# Patient Record
Sex: Male | Born: 1955 | Race: White | Hispanic: No | Marital: Single | Smoking: Former smoker
Health system: Southern US, Community
[De-identification: ages and names within clinical notes are randomized; demographics above are authoritative.]

## PROBLEM LIST (undated history)

## (undated) DIAGNOSIS — T7840XA Allergy, unspecified, initial encounter: Secondary | ICD-10-CM

## (undated) DIAGNOSIS — N200 Calculus of kidney: Secondary | ICD-10-CM

## (undated) DIAGNOSIS — Z860100 Personal history of colon polyps, unspecified: Secondary | ICD-10-CM

## (undated) DIAGNOSIS — C801 Malignant (primary) neoplasm, unspecified: Secondary | ICD-10-CM

## (undated) HISTORY — DX: Personal history of colon polyps, unspecified: Z86.0100

## (undated) HISTORY — DX: Calculus of kidney: N20.0

## (undated) HISTORY — DX: Malignant (primary) neoplasm, unspecified: C80.1

## (undated) HISTORY — DX: Personal history of colonic polyps: Z86.010

## (undated) HISTORY — DX: Allergy, unspecified, initial encounter: T78.40XA

---

## 2012-06-24 LAB — HM COLONOSCOPY

## 2013-05-09 LAB — HM COLONOSCOPY

## 2014-06-24 LAB — HM COLONOSCOPY

## 2017-06-01 ENCOUNTER — Ambulatory Visit: Payer: Self-pay | Admitting: Internal Medicine

## 2017-06-22 ENCOUNTER — Encounter: Payer: Self-pay | Admitting: Internal Medicine

## 2017-06-22 ENCOUNTER — Ambulatory Visit: Payer: BC Managed Care – PPO | Admitting: Internal Medicine

## 2017-06-22 VITALS — BP 100/72 | HR 57 | Temp 97.5°F | Resp 15 | Ht 74.25 in | Wt 177.8 lb

## 2017-06-22 DIAGNOSIS — M778 Other enthesopathies, not elsewhere classified: Secondary | ICD-10-CM

## 2017-06-22 DIAGNOSIS — E785 Hyperlipidemia, unspecified: Secondary | ICD-10-CM | POA: Diagnosis not present

## 2017-06-22 DIAGNOSIS — R5383 Other fatigue: Secondary | ICD-10-CM | POA: Diagnosis not present

## 2017-06-22 DIAGNOSIS — Z13220 Encounter for screening for lipoid disorders: Secondary | ICD-10-CM | POA: Diagnosis not present

## 2017-06-22 DIAGNOSIS — L659 Nonscarring hair loss, unspecified: Secondary | ICD-10-CM | POA: Diagnosis not present

## 2017-06-22 DIAGNOSIS — Z0000 Encounter for general adult medical examination without abnormal findings: Secondary | ICD-10-CM | POA: Diagnosis not present

## 2017-06-22 DIAGNOSIS — Z87891 Personal history of nicotine dependence: Secondary | ICD-10-CM | POA: Diagnosis not present

## 2017-06-22 DIAGNOSIS — Z125 Encounter for screening for malignant neoplasm of prostate: Secondary | ICD-10-CM | POA: Diagnosis not present

## 2017-06-22 LAB — COMPREHENSIVE METABOLIC PANEL
ALT: 20 U/L (ref 0–53)
AST: 20 U/L (ref 0–37)
Albumin: 4.4 g/dL (ref 3.5–5.2)
Alkaline Phosphatase: 71 U/L (ref 39–117)
BUN: 13 mg/dL (ref 6–23)
CHLORIDE: 102 meq/L (ref 96–112)
CO2: 29 meq/L (ref 19–32)
Calcium: 9.4 mg/dL (ref 8.4–10.5)
Creatinine, Ser: 0.92 mg/dL (ref 0.40–1.50)
GFR: 89.03 mL/min (ref >60.00)
GLUCOSE: 77 mg/dL (ref 70–99)
POTASSIUM: 4.6 meq/L (ref 3.5–5.1)
Sodium: 139 mEq/L (ref 135–145)
Total Bilirubin: 1.2 mg/dL (ref 0.2–1.2)
Total Protein: 6.7 g/dL (ref 6.0–8.3)

## 2017-06-22 LAB — CBC WITH DIFFERENTIAL/PLATELET
BASOS ABS: 0.1 10*3/uL (ref 0.0–0.1)
Basophils Relative: 1.1 % (ref 0.0–3.0)
Eosinophils Absolute: 0 10*3/uL (ref 0.0–0.7)
Eosinophils Relative: 0.5 % (ref 0.0–5.0)
HCT: 50.5 % (ref 39.0–52.0)
Hemoglobin: 17.4 g/dL — ABNORMAL HIGH (ref 13.0–17.0)
LYMPHS ABS: 1.5 10*3/uL (ref 0.7–4.0)
Lymphocytes Relative: 22.7 % (ref 12.0–46.0)
MCHC: 34.4 g/dL (ref 30.0–36.0)
MCV: 95.5 fl (ref 78.0–100.0)
MONO ABS: 0.5 10*3/uL (ref 0.1–1.0)
MONOS PCT: 8.4 % (ref 3.0–12.0)
NEUTROS ABS: 4.3 10*3/uL (ref 1.4–7.7)
NEUTROS PCT: 67.3 % (ref 43.0–77.0)
PLATELETS: 213 10*3/uL (ref 150.0–400.0)
RBC: 5.29 Mil/uL (ref 4.22–5.81)
RDW: 13.1 % (ref 11.5–15.5)
WBC: 6.4 10*3/uL (ref 4.0–10.5)

## 2017-06-22 LAB — LIPID PANEL
CHOL/HDL RATIO: 4
Cholesterol: 200 mg/dL (ref 0–200)
HDL: 49.4 mg/dL (ref >39.00)
LDL CALC: 135 mg/dL — AB (ref 0–99)
NonHDL: 150.73
TRIGLYCERIDES: 77 mg/dL (ref 0.0–149.0)
VLDL: 15.4 mg/dL (ref 0.0–40.0)

## 2017-06-22 LAB — TSH: TSH: 1.29 u[IU]/mL (ref 0.35–4.50)

## 2017-06-22 LAB — PSA: PSA: 0.13 ng/mL (ref 0.10–4.00)

## 2017-06-22 MED ORDER — ZOSTER VAC RECOMB ADJUVANTED 50 MCG/0.5ML IM SUSR: 0.5000 mL | Freq: Once | INTRAMUSCULAR | 1 refills | Status: AC

## 2017-06-22 MED ORDER — MELOXICAM 15 MG PO TABS: 15.0000 mg | ORAL_TABLET | Freq: Every day | ORAL | 2 refills | Status: DC

## 2017-06-22 NOTE — Progress Notes (Signed)
Patient ID: Bobby Mills, male    DOB: 1956-04-29  Age: 62 y.o. MRN: [id number]  The patient is here for annual preventive  examination and for establishment of care. He is referred by his mother, Bobby Mills.     Last colonoscopy was 2 or 3 years ago and follow up Is inclear  He has a history of polyps.  Records at [facility].        SH:  History of tobacco abuse,  Quit 6 years ago,  Smoked for 30 years only socially,  < 1/2 PACK  Per week  AND ONLY ON WEEKENDS,  QUIT 6 YEARS AGO.  Does not drink alcohol except on special occasions.         The roster of all physicians providing medical care to patient - is listed in the Snapshot section of the chart.  Activities of daily living:  The patient is 100% independent in all ADLs: dressing, toileting, feeding as well as independent mobility  Home safety : The patient has smoke detectors in the home. They wear seatbelts.  There are no firearms at home. There is no violence in the home.   There is no risks for hepatitis, STDs or HIV. There is no   history of blood transfusion. They have no travel history to infectious disease endemic areas of the world.  The patient has seen their dentist in the last six month. They have seen their eye doctor in the last year. They admit to slight hearing difficulty with regard to whispered voices and some television programs.  They have deferred audiologic testing in the last year.  They do not  have excessive sun exposure. Discussed the need for sun protection: hats, long sleeves and use of sunscreen if there is significant sun exposure.   Diet: the importance of a healthy diet is discussed. They do have a healthy diet.  The benefits of regular aerobic exercise were discussed. She walks 4 times per week ,  20 minutes.   Depression screen: there are no signs or vegative symptoms of depression- irritability, change in appetite, anhedonia, sadness/tearfullness.  Cognitive assessment: the patient manages  all their financial and personal affairs and is actively engaged. They could relate day,date,year and events; recalled 2/3 objects at 3 minutes; performed clock-face test normally.  The following portions of the patient's history were reviewed and updated as appropriate: allergies, current medications, past family history, past medical history,  past surgical history, past social history  and problem list.  Visual acuity was not assessed per patient preference since she has regular follow up with her ophthalmologist. Hearing and body mass index were assessed and reviewed.   During the course of the visit the patient was educated and counseled about appropriate screening and preventive services including : fall prevention , diabetes screening, nutrition counseling, colorectal cancer screening, and recommended immunizations.    CC: The primary encounter diagnosis was Screening for hyperlipidemia. Diagnoses of Prostate cancer screening and Fatigue, unspecified type were also pertinent to this visit.  Establishment of care.  He is referred by his mother, Bobby Mills.  H has recently relocated from [place], [place] to assist his parents  to take care of patients. His father has  Parkinson's  Disease and is in an assisted living facility    He currently is not exercising. He continues to work in [place] and commutes 90 minutes one way . Gets up at 5 , leaves house at  6:30  [place] for constipation management.  Alopecia:  Takes finasteride for hair loss.  PSA has been low   Occasional insomnia managed without medications LEFT MEDIAL ELBOW pain,  Mild.  Aggravated by use of biceps    History of tobacco abuse,  Quit 6 years ago,  Smoked for 30 years only socially,  < 1/2 PACK  Per week  AND ONLY ON WEEKENDS,  QUIT 6 YEARS AGO       Review of Systems:  Patient denies headache, fevers, malaise, unintentional weight loss, skin rash, eye pain, sinus congestion and sinus pain, sore throat, dysphagia,   hemoptysis , cough, dyspnea, wheezing, chest pain, palpitations, orthopnea, edema, abdominal pain, nausea, melena, diarrhea, constipation, flank pain, dysuria, hematuria, urinary  Frequency, nocturia, numbness, tingling, seizures,  Focal weakness, Loss of consciousness,  Tremor, insomnia, depression, anxiety, and suicidal ideation.    Family History  Problem Relation Age of Onset  . Hearing loss Mother   . Hypertension Mother   . Hearing loss Father   . Hyperlipidemia Father   . Heart disease Father 20       CAD, 7 vessel bypass at age 24   . Cancer Father   . Heart failure Paternal Grandmother   . Heart attack Paternal Grandfather    Social History   Socioeconomic History  . Marital status: Single    Spouse name: Not on file  . Number of children: Not on file  . Years of education: Not on file  . Highest education level: Not on file  Social Needs  . Financial resource strain: Not on file  . Food insecurity - worry: Not on file  . Food insecurity - inability: Not on file  . Transportation needs - medical: Not on file  . Transportation needs - non-medical: Not on file  Occupational History  . Occupation: Dance movement psychotherapist  Tobacco Use  . Smoking status: Former Smoker    Packs/day: 0.10    Years: 30.00    Pack years: 3.00    Types: Cigarettes  . Smokeless tobacco: Never Used  Substance and Sexual Activity  . Alcohol use: No    Frequency: Never  . Drug use: No  . Sexual activity: No  Other Topics Concern  . Not on file  Social History Narrative   Patient commmutes to [place] daily for work   He relocated to [place] from [place],  To assist his parents in their old age.    Vital Signs:  BP 100/72 (BP Location: Left Arm, Patient Position: Sitting, Cuff Size: Normal)   Pulse (!) 57   Temp (!) 97.5 F (36.4 C) (Oral)   Resp 15   Ht 6' 2.25" (1.886 m)   Wt 177 lb 12.8 oz (80.6 kg)   SpO2 99%   BMI 22.67 kg/m   Physical Exam:   General appearance: alert,  cooperative and appears stated age Ears: normal TM's and external ear canals both ears Throat: lips, mucosa, and tongue normal; teeth and gums normal Neck: no adenopathy, no carotid bruit, supple, symmetrical, trachea midline and thyroid not enlarged, symmetric, no tenderness/mass/nodules Back: symmetric, no curvature. ROM normal. No CVA tenderness. Lungs: clear to auscultation bilaterally Heart: regular rate and rhythm, S1, S2 normal, no murmur, click, rub or gallop Abdomen: soft, non-tender; bowel sounds normal; no masses,  no organomegaly Pulses: 2+ and symmetric Skin: Skin color, texture, turgor normal. No rashes or lesions Lymph nodes: Cervical, supraclavicular, and axillary nodes normal.  Encounter for preventive health examination Annual comprehensive preventive exam was done as well as  an evaluation and management of acute and chronic conditions .  During the course of the visit the patient was educated and counseled about appropriate screening and preventive services including :  diabetes screening, lipid analysis with projected  10 year  risk for CAD , nutrition counseling, prostate and colorectal cancer screening, and recommended immunizations.  Printed recommendations for health maintenance screenings was given.   Lab Results  Component Value Date   PSA 0.13 06/22/2017   Lab Results  Component Value Date   CHOL 200 06/22/2017   HDL 49.40 06/22/2017   LDLCALC 135 (H) 06/22/2017   TRIG 77.0 06/22/2017   CHOLHDL 4 06/22/2017     Hyperlipidemia LDL goal <130 Based on current lipid profile, the risk of clinically significant Coronary artery disease is 9% over the next 10 years, using the Framingham risk calculator.  He is an ex smoker and has no first degree relatives with early CAD.  Will recommend red yeast rice and repeat lipids in 6 months    Left elbow tendonitis Mild, secondary to overuse of biceps using home dumbbells.  Modification of behavior and use of NSAIDs advised    Alopecia Managed with finasteride. Thyroid screen normal.   Lab Results  Component Value Date   TSH 1.29 06/22/2017      Updated Medication List Outpatient Encounter Medications as of 06/22/2017  Medication Sig  . finasteride (PROPECIA) 1 MG tablet Take 1 mg by mouth daily.  . meloxicam (MOBIC) 15 MG tablet Take 1 tablet (15 mg total) by mouth daily.  . [EXPIRED] Zoster Vaccine Adjuvanted [facility]) injection Inject 0.5 mLs into the muscle once for 1 dose.   No facility-administered encounter medications on file as of 06/22/2017.

## 2017-06-22 NOTE — Patient Instructions (Addendum)
WELCOME !  IT WAS NICE TO MEET YOU !   FOR YOUR INSOMNIA:  Try using the "Headspace" app on your I phone for management of insomnia due to anxiety   melatonin ok up to 5 mg may work   Ok to have 2 alcoholic beverages daily (this is moderation)   Try to get 30 minutes of cardio in   5 days per week (ultimate goal)    Health Maintenance, Male A healthy lifestyle and preventive care is important for your health and wellness. Ask your health care provider about what schedule of regular examinations is right for you. What should I know about weight and diet? Eat a Healthy Diet  Eat plenty of vegetables, fruits, whole grains, low-fat dairy products, and lean protein.  Do not eat a lot of foods high in solid fats, added sugars, or salt.  Maintain a Healthy Weight Regular exercise can help you achieve or maintain a healthy weight. You should:  Do at least 150 minutes of exercise each week. The exercise should increase your heart rate and make you sweat (moderate-intensity exercise).  Do strength-training exercises at least twice a week.  Watch Your Levels of Cholesterol and Blood Lipids  Have your blood tested for lipids and cholesterol every 5 years starting at 61 years of age. If you are at high risk for heart disease, you should start having your blood tested when you are 61 years old. You may need to have your cholesterol levels checked more often if: ? Your lipid or cholesterol levels are high. ? You are older than 61 years of age. ? You are at high risk for heart disease.  What should I know about cancer screening? Many types of cancers can be detected early and may often be prevented. Lung Cancer  You should be screened every year for lung cancer if: ? You are a current smoker who has smoked for at least 30 years. ? You are a former smoker who has quit within the past 15 years.  Talk to your health care provider about your screening options, when you should start  screening, and how often you should be screened.  Colorectal Cancer  Routine colorectal cancer screening usually begins at 61 years of age and should be repeated every 5-10 years until you are 61 years old. You may need to be screened more often if early forms of precancerous polyps or small growths are found. Your health care provider may recommend screening at an earlier age if you have risk factors for colon cancer.  Your health care provider may recommend using home test kits to check for hidden blood in the stool.  A small camera at the end of a tube can be used to examine your colon (sigmoidoscopy or colonoscopy). This checks for the earliest forms of colorectal cancer.  Prostate and Testicular Cancer  Depending on your age and overall health, your health care provider may do certain tests to screen for prostate and testicular cancer.  Talk to your health care provider about any symptoms or concerns you have about testicular or prostate cancer.  Skin Cancer  Check your skin from head to toe regularly.  Tell your health care provider about any new moles or changes in moles, especially if: ? There is a change in a mole's size, shape, or color. ? You have a mole that is larger than a pencil eraser.  Always use sunscreen. Apply sunscreen liberally and repeat throughout the day.  Protect yourself by  wearing long sleeves, pants, a wide-brimmed hat, and sunglasses when outside.  What should I know about heart disease, diabetes, and high blood pressure?  If you are 12-78 years of age, have your blood pressure checked every 3-5 years. If you are 24 years of age or older, have your blood pressure checked every year. You should have your blood pressure measured twice-once when you are at a hospital or clinic, and once when you are not at a hospital or clinic. Record the average of the two measurements. To check your blood pressure when you are not at a hospital or clinic, you can use: ? An  automated blood pressure machine at a pharmacy. ? A home blood pressure monitor.  Talk to your health care provider about your target blood pressure.  If you are between 63-58 years old, ask your health care provider if you should take aspirin to prevent heart disease.  Have regular diabetes screenings by checking your fasting blood sugar level. ? If you are at a normal weight and have a low risk for diabetes, have this test once every three years after the age of 34. ? If you are overweight and have a high risk for diabetes, consider being tested at a younger age or more often.  A one-time screening for abdominal aortic aneurysm (AAA) by ultrasound is recommended for men aged 82-75 years who are current or former smokers. What should I know about preventing infection? Hepatitis B If you have a higher risk for hepatitis B, you should be screened for this virus. Talk with your health care provider to find out if you are at risk for hepatitis B infection. Hepatitis C Blood testing is recommended for:  Everyone born from [birth year] through 1964.  Anyone with known risk factors for hepatitis C.  Sexually Transmitted Diseases (STDs)  You should be screened each year for STDs including gonorrhea and chlamydia if: ? You are sexually active and are younger than 61 years of age. ? You are older than 61 years of age and your health care provider tells you that you are at risk for this type of infection. ? Your sexual activity has changed since you were last screened and you are at an increased risk for chlamydia or gonorrhea. Ask your health care provider if you are at risk.  Talk with your health care provider about whether you are at high risk of being infected with HIV. Your health care provider may recommend a prescription medicine to help prevent HIV infection.  What else can I do?  Schedule regular health, dental, and eye exams.  Stay current with your vaccines (immunizations).  Do not use  any tobacco products, such as cigarettes, chewing tobacco, and e-cigarettes. If you need help quitting, ask your health care provider.  Limit alcohol intake to no more than 2 drinks per day. One drink equals 12 ounces of beer, 5 ounces of wine, or 1 ounces of hard liquor.  Do not use street drugs.  Do not share needles.  Ask your health care provider for help if you need support or information about quitting drugs.  Tell your health care provider if you often feel depressed.  Tell your health care provider if you have ever been abused or do not feel safe at home. This information is not intended to replace advice given to you by your health care provider. Make sure you discuss any questions you have with your health care provider. Document Released: 12/02/2007 Document Revised: 02/02/2016 Document  Reviewed: 03/09/2015 Elsevier Interactive Patient Education  [employer].

## 2017-06-24 ENCOUNTER — Encounter: Payer: Self-pay | Admitting: Internal Medicine

## 2017-06-24 DIAGNOSIS — M778 Other enthesopathies, not elsewhere classified: Secondary | ICD-10-CM | POA: Insufficient documentation

## 2017-06-24 DIAGNOSIS — L659 Nonscarring hair loss, unspecified: Secondary | ICD-10-CM | POA: Insufficient documentation

## 2017-06-24 DIAGNOSIS — E785 Hyperlipidemia, unspecified: Secondary | ICD-10-CM | POA: Insufficient documentation

## 2017-06-24 DIAGNOSIS — Z87891 Personal history of nicotine dependence: Secondary | ICD-10-CM | POA: Insufficient documentation

## 2017-06-24 DIAGNOSIS — Z0000 Encounter for general adult medical examination without abnormal findings: Secondary | ICD-10-CM | POA: Insufficient documentation

## 2017-06-24 NOTE — Assessment & Plan Note (Addendum)
Annual comprehensive preventive exam was done as well as an evaluation and management of acute and chronic conditions .  During the course of the visit the patient was educated and counseled about appropriate screening and preventive services including :  diabetes screening, lipid analysis with projected  10 year  risk for CAD , nutrition counseling, prostate and colorectal cancer screening, and recommended immunizations.  Printed recommendations for health maintenance screenings was given.   Lab Results  Component Value Date   PSA 0.13 06/22/2017   Lab Results  Component Value Date   CHOL 200 06/22/2017   HDL 49.40 06/22/2017   LDLCALC 135 (H) 06/22/2017   TRIG 77.0 06/22/2017   CHOLHDL 4 06/22/2017

## 2017-06-24 NOTE — Assessment & Plan Note (Addendum)
Managed with finasteride. Thyroid screen normal.   Lab Results  Component Value Date   TSH 1.29 06/22/2017

## 2017-06-24 NOTE — Assessment & Plan Note (Signed)
Based on current lipid profile, the risk of clinically significant Coronary artery disease is 9% over the next 10 years, using the Framingham risk calculator.  He is an ex smoker and has no first degree relatives with early CAD.  Will recommend red yeast rice and repeat lipids in 6 months

## 2017-06-24 NOTE — Assessment & Plan Note (Signed)
Mild, secondary to overuse of biceps using home dumbbells.  Modification of behavior and use of NSAIDs advised

## 2017-06-25 ENCOUNTER — Encounter: Payer: Self-pay | Admitting: Internal Medicine

## 2017-06-27 ENCOUNTER — Other Ambulatory Visit: Payer: Self-pay | Admitting: Internal Medicine

## 2017-06-27 DIAGNOSIS — Z79899 Other long term (current) drug therapy: Secondary | ICD-10-CM

## 2017-08-07 ENCOUNTER — Encounter: Payer: Self-pay | Admitting: Internal Medicine

## 2017-08-08 ENCOUNTER — Other Ambulatory Visit: Payer: Self-pay | Admitting: Internal Medicine

## 2017-08-17 ENCOUNTER — Other Ambulatory Visit (INDEPENDENT_AMBULATORY_CARE_PROVIDER_SITE_OTHER): Payer: BC Managed Care – PPO

## 2017-08-17 DIAGNOSIS — Z79899 Other long term (current) drug therapy: Secondary | ICD-10-CM

## 2017-08-17 LAB — COMPREHENSIVE METABOLIC PANEL
ALT: 19 U/L (ref 0–53)
AST: 18 U/L (ref 0–37)
Albumin: 3.9 g/dL (ref 3.5–5.2)
Alkaline Phosphatase: 66 U/L (ref 39–117)
BILIRUBIN TOTAL: 0.7 mg/dL (ref 0.2–1.2)
BUN: 14 mg/dL (ref 6–23)
CO2: 31 meq/L (ref 19–32)
CREATININE: 0.99 mg/dL (ref 0.40–1.50)
Calcium: 9.6 mg/dL (ref 8.4–10.5)
Chloride: 105 mEq/L (ref 96–112)
GFR: 81.76 mL/min (ref >60.00)
GLUCOSE: 79 mg/dL (ref 70–99)
Potassium: 4.1 mEq/L (ref 3.5–5.1)
Sodium: 142 mEq/L (ref 135–145)
Total Protein: 7 g/dL (ref 6.0–8.3)

## 2018-06-28 ENCOUNTER — Encounter: Payer: Self-pay | Admitting: Internal Medicine

## 2018-06-28 ENCOUNTER — Ambulatory Visit (INDEPENDENT_AMBULATORY_CARE_PROVIDER_SITE_OTHER): Payer: BC Managed Care – PPO | Admitting: Internal Medicine

## 2018-06-28 VITALS — BP 116/78 | HR 57 | Temp 98.0°F | Resp 15 | Ht 74.25 in | Wt 193.0 lb

## 2018-06-28 DIAGNOSIS — Z113 Encounter for screening for infections with a predominantly sexual mode of transmission: Secondary | ICD-10-CM

## 2018-06-28 DIAGNOSIS — E785 Hyperlipidemia, unspecified: Secondary | ICD-10-CM

## 2018-06-28 DIAGNOSIS — Z0000 Encounter for general adult medical examination without abnormal findings: Secondary | ICD-10-CM

## 2018-06-28 LAB — COMPREHENSIVE METABOLIC PANEL
ALT: 24 U/L (ref 0–53)
AST: 19 U/L (ref 0–37)
Albumin: 4.3 g/dL (ref 3.5–5.2)
Alkaline Phosphatase: 69 U/L (ref 39–117)
BUN: 16 mg/dL (ref 6–23)
CO2: 28 mEq/L (ref 19–32)
Calcium: 9.5 mg/dL (ref 8.4–10.5)
Chloride: 104 mEq/L (ref 96–112)
Creatinine, Ser: 0.92 mg/dL (ref 0.40–1.50)
GFR: 88.73 mL/min (ref >60.00)
Glucose, Bld: 90 mg/dL (ref 70–99)
Potassium: 4.4 mEq/L (ref 3.5–5.1)
Sodium: 139 mEq/L (ref 135–145)
Total Bilirubin: 0.8 mg/dL (ref 0.2–1.2)
Total Protein: 6.6 g/dL (ref 6.0–8.3)

## 2018-06-28 LAB — LIPID PANEL
CHOLESTEROL: 184 mg/dL (ref 0–200)
HDL: 44.8 mg/dL (ref >39.00)
LDL Cholesterol: 117 mg/dL — ABNORMAL HIGH (ref 0–99)
NonHDL: 139.48
Total CHOL/HDL Ratio: 4
Triglycerides: 112 mg/dL (ref 0.0–149.0)
VLDL: 22.4 mg/dL (ref 0.0–40.0)

## 2018-06-28 LAB — TSH: TSH: 1.13 u[IU]/mL (ref 0.35–4.50)

## 2018-06-28 LAB — PSA: PSA: 0.13 ng/mL (ref 0.10–4.00)

## 2018-06-28 NOTE — Progress Notes (Signed)
Patient ID: Bobby Mills, male    DOB: 04/24/1956  Age: 63 y.o. MRN: [id number]  The patient is here for annual wellness examination and management of other chronic and acute problems.   The risk factors are reflected in the social history.  The roster of all physicians providing medical care to patient - is listed in the Snapshot section of the chart.  Activities of daily living:  The patient is 100% independent in all ADLs: dressing, toileting, feeding as well as independent mobility  Home safety : The patient has smoke detectors in the home. They wear seatbelts.  There are no firearms at home. There is no violence in the home.   There is no risks for hepatitis, STDs or HIV. There is no   history of blood transfusion. They have no travel history to infectious disease endemic areas of the world.  The patient has seen their dentist in the last six month. They have seen their eye doctor in the last year. They admit to slight hearing difficulty with regard to whispered voices and some television programs.  They have deferred audiologic testing in the last year.  They do not  have excessive sun exposure. Discussed the need for sun protection: hats, long sleeves and use of sunscreen if there is significant sun exposure.   Diet: the importance of a healthy diet is discussed. They do have a healthy diet.  The benefits of regular aerobic exercise were discussed. He has not been exercising for the past year due to his long commute to [place] every day .   Depression screen: there are no signs or vegative symptoms of depression- irritability, change in appetite, anhedonia, sadness/tearfullness. The following portions of the patient's history were reviewed and updated as appropriate: allergies, current medications, past family history, past medical history,  past surgical history, past social history  and problem list.  Visual acuity was not assessed per patient preference since she has regular follow up  with her ophthalmologist. Hearing and body mass index were assessed and reviewed.   During the course of the visit the patient was educated and counseled about appropriate screening and preventive services including : fall prevention , diabetes screening, nutrition counseling, colorectal cancer screening, and recommended immunizations.    CC: The primary encounter diagnosis was Hyperlipidemia LDL goal <130. Diagnoses of Encounter for preventive health examination and Screen for STD (sexually transmitted disease) were also pertinent to this visit.   1) constipation.  Occurs Only during the week, aggravated by long commute.   2) bloating and gas.  Eats too fast .  Reviewed diet. No weight changes  History Bobby Mills has a past medical history of Allergy and History of colon polyps.   He has no past surgical history on file.   His family history includes Cancer in his father; Hearing loss in his father and mother; Heart attack in his paternal grandfather; Heart disease (age of onset: 51) in his father; Heart failure in his paternal grandmother; Hyperlipidemia in his father; Hypertension in his mother.He reports that he has quit smoking. His smoking use included cigarettes. He has a 3.00 pack-year smoking history. He has never used smokeless tobacco. He reports that he does not drink alcohol or use drugs.  Outpatient Medications Prior to Visit  Medication Sig Dispense Refill  . finasteride (PROPECIA) 1 MG tablet Take 1 mg by mouth daily.    . Red Yeast Rice 600 MG CAPS Take 1,200 mg by mouth daily.    . meloxicam (MOBIC)  15 MG tablet Take 1 tablet (15 mg total) by mouth daily. (Patient not taking: Reported on 06/28/2018) 30 tablet 2   No facility-administered medications prior to visit.     Review of Systems   Patient denies headache, fevers, malaise, unintentional weight loss, skin rash, eye pain, sinus congestion and sinus pain, sore throat, dysphagia,  hemoptysis , cough, dyspnea, wheezing,  chest pain, palpitations, orthopnea, edema, abdominal pain, nausea, melena, diarrhea, constipation, flank pain, dysuria, hematuria, urinary  Frequency, nocturia, numbness, tingling, seizures,  Focal weakness, Loss of consciousness,  Tremor, insomnia, depression, anxiety, and suicidal ideation.      Objective:  BP 116/78 (BP Location: Left Arm, Patient Position: Sitting, Cuff Size: Normal)   Pulse (!) 57   Temp 98 F (36.7 C) (Oral)   Resp 15   Ht 6' 2.25" (1.886 m)   Wt 193 lb (87.5 kg)   SpO2 98%   BMI 24.61 kg/m   Physical Exam   General appearance: alert, cooperative and appears stated age Ears: normal TM's and external ear canals both ears Throat: lips, mucosa, and tongue normal; teeth and gums normal Neck: no adenopathy, no carotid bruit, supple, symmetrical, trachea midline and thyroid not enlarged, symmetric, no tenderness/mass/nodules Back: symmetric, no curvature. ROM normal. No CVA tenderness. Lungs: clear to auscultation bilaterally Heart: regular rate and rhythm, S1, S2 normal, no murmur, click, rub or gallop Abdomen: soft, non-tender; bowel sounds normal; no masses,  no organomegaly Pulses: 2+ and symmetric Skin: Skin color, texture, turgor normal. No rashes or lesions Lymph nodes: Cervical, supraclavicular, and axillary nodes normal.    Assessment & Plan:   Problem List Items Addressed This Visit    Encounter for preventive health examination    age appropriate education and counseling updated, referrals for preventative services and immunizations addressed, dietary and smoking counseling addressed, most recent labs reviewed.  I have personally reviewed and have noted:  1) the patient's medical and social history 2) The pt's use of alcohol, tobacco, and illicit drugs 3) The patient's current medications and supplements 4) Functional ability including ADL's, fall risk, home safety risk, hearing and visual impairment 5) Diet and physical activities 6) Evidence  for depression or mood disorder 7) The patient's height, weight, and BMI have been recorded in the chart  I have made referrals, and provided counseling and education based on review of the above      Relevant Orders   PSA (Completed)   Hyperlipidemia LDL goal <130 - Primary    Based on last year's current lipid profile, the risk of clinically significant Coronary artery disease was  9% over the next 10 years, using the Framingham risk calculator.  He is an ex smoker and has no first degree relatives with early CAD.  He is taking  red yeast rice and repeat lipids have improved .  No changes today.    Lab Results  Component Value Date   CHOL 184 06/28/2018   HDL 44.80 06/28/2018   LDLCALC 117 (H) 06/28/2018   TRIG 112.0 06/28/2018   CHOLHDL 4 06/28/2018         Relevant Orders   Lipid panel (Completed)   Comprehensive metabolic panel (Completed)   TSH (Completed)    Other Visit Diagnoses    Screen for STD (sexually transmitted disease)       Relevant Orders   Hepatitis C antibody (Completed)   HIV Antibody (routine testing w rflx) (Completed)      I have discontinued Bing Quarry "Ed"'s meloxicam.  I am also having him maintain his finasteride and Red Yeast Rice.  No orders of the defined types were placed in this encounter.   Medications Discontinued During This Encounter  Medication Reason  . meloxicam (MOBIC) 15 MG tablet     Follow-up: No follow-ups on file.   Crecencio Mc, MD

## 2018-06-28 NOTE — Patient Instructions (Addendum)
For your constipation:  Try mixing citrucel with miralax  In 8 ounces of water  Alternative :  stool softener up to 200 mg docusate (colace) at bedtime   Also  try drinking  16 ounces of water first thing in the AM upon waking     For your bloating and gas:  Eat slower.  Be mindful of inhaling during intake of food and avoid it  Try taking Beano before meals containing vegetables  Gas X,  Phasyme,  Or Mylanta GAS for management  DON'T OVEREAT.  This causes reflux    Health Maintenance, Male A healthy lifestyle and preventive care is important for your health and wellness. Ask your health care provider about what schedule of regular examinations is right for you. What should I know about weight and diet? Eat a Healthy Diet  Eat plenty of vegetables, fruits, whole grains, low-fat dairy products, and lean protein.  Do not eat a lot of foods high in solid fats, added sugars, or salt.  Maintain a Healthy Weight Regular exercise can help you achieve or maintain a healthy weight. You should:  Do at least 150 minutes of exercise each week. The exercise should increase your heart rate and make you sweat (moderate-intensity exercise).  Do strength-training exercises at least twice a week. Watch Your Levels of Cholesterol and Blood Lipids  Have your blood tested for lipids and cholesterol every 5 years starting at 62 years of age. If you are at high risk for heart disease, you should start having your blood tested when you are 62 years old. You may need to have your cholesterol levels checked more often if: ? Your lipid or cholesterol levels are high. ? You are older than 62 years of age. ? You are at high risk for heart disease. What should I know about cancer screening? Many types of cancers can be detected early and may often be prevented. Lung Cancer  You should be screened every year for lung cancer if: ? You are a current smoker who has smoked for at least 30 years. ? You  are a former smoker who has quit within the past 15 years.  Talk to your health care provider about your screening options, when you should start screening, and how often you should be screened. Colorectal Cancer  Routine colorectal cancer screening usually begins at 62 years of age and should be repeated every 5-10 years until you are 62 years old. You may need to be screened more often if early forms of precancerous polyps or small growths are found. Your health care provider may recommend screening at an earlier age if you have risk factors for colon cancer.  Your health care provider may recommend using home test kits to check for hidden blood in the stool.  A small camera at the end of a tube can be used to examine your colon (sigmoidoscopy or colonoscopy). This checks for the earliest forms of colorectal cancer. Prostate and Testicular Cancer  Depending on your age and overall health, your health care provider may do certain tests to screen for prostate and testicular cancer.  Talk to your health care provider about any symptoms or concerns you have about testicular or prostate cancer. Skin Cancer  Check your skin from head to toe regularly.  Tell your health care provider about any new moles or changes in moles, especially if: ? There is a change in a mole's size, shape, or color. ? You have a mole that is larger  than a pencil eraser.  Always use sunscreen. Apply sunscreen liberally and repeat throughout the day.  Protect yourself by wearing long sleeves, pants, a wide-brimmed hat, and sunglasses when outside. What should I know about heart disease, diabetes, and high blood pressure?  If you are 36-24 years of age, have your blood pressure checked every 3-5 years. If you are 54 years of age or older, have your blood pressure checked every year. You should have your blood pressure measured twice-once when you are at a hospital or clinic, and once when you are not at a hospital or  clinic. Record the average of the two measurements. To check your blood pressure when you are not at a hospital or clinic, you can use: ? An automated blood pressure machine at a pharmacy. ? A home blood pressure monitor.  Talk to your health care provider about your target blood pressure.  If you are between 65-41 years old, ask your health care provider if you should take aspirin to prevent heart disease.  Have regular diabetes screenings by checking your fasting blood sugar level. ? If you are at a normal weight and have a low risk for diabetes, have this test once every three years after the age of 85. ? If you are overweight and have a high risk for diabetes, consider being tested at a younger age or more often.  A one-time screening for abdominal aortic aneurysm (AAA) by ultrasound is recommended for men aged 36-75 years who are current or former smokers. What should I know about preventing infection? Hepatitis B If you have a higher risk for hepatitis B, you should be screened for this virus. Talk with your health care provider to find out if you are at risk for hepatitis B infection. Hepatitis C Blood testing is recommended for:  Everyone born from [birth year] through 1964.  Anyone with known risk factors for hepatitis C. Sexually Transmitted Diseases (STDs)  You should be screened each year for STDs including gonorrhea and chlamydia if: ? You are sexually active and are younger than 62 years of age. ? You are older than 62 years of age and your health care provider tells you that you are at risk for this type of infection. ? Your sexual activity has changed since you were last screened and you are at an increased risk for chlamydia or gonorrhea. Ask your health care provider if you are at risk.  Talk with your health care provider about whether you are at high risk of being infected with HIV. Your health care provider may recommend a prescription medicine to help prevent HIV  infection. What else can I do?  Schedule regular health, dental, and eye exams.  Stay current with your vaccines (immunizations).  Do not use any tobacco products, such as cigarettes, chewing tobacco, and e-cigarettes. If you need help quitting, ask your health care provider.  Limit alcohol intake to no more than 2 drinks per day. One drink equals 12 ounces of beer, 5 ounces of wine, or 1 ounces of hard liquor.  Do not use street drugs.  Do not share needles.  Ask your health care provider for help if you need support or information about quitting drugs.  Tell your health care provider if you often feel depressed.  Tell your health care provider if you have ever been abused or do not feel safe at home. This information is not intended to replace advice given to you by your health care provider. Make sure you  discuss any questions you have with your health care provider. Document Released: 12/02/2007 Document Revised: 02/02/2016 Document Reviewed: 03/09/2015 Elsevier Interactive Patient Education  2019 [employer].

## 2018-06-29 LAB — HEPATITIS C ANTIBODY
Hepatitis C Ab: NONREACTIVE
SIGNAL TO CUT-OFF: 0.03 (ref <1.00)

## 2018-06-29 LAB — HIV ANTIBODY (ROUTINE TESTING W REFLEX): HIV 1&2 Ab, 4th Generation: NONREACTIVE

## 2018-06-30 ENCOUNTER — Encounter: Payer: Self-pay | Admitting: Internal Medicine

## 2018-06-30 NOTE — Assessment & Plan Note (Signed)

## 2018-06-30 NOTE — Assessment & Plan Note (Addendum)
Based on last year's current lipid profile, the risk of clinically significant Coronary artery disease was  9% over the next 10 years, using the Framingham risk calculator.  He is an ex smoker and has no first degree relatives with early CAD.  He is taking  red yeast rice and repeat lipids have improved .  No changes today.    Lab Results  Component Value Date   CHOL 184 06/28/2018   HDL 44.80 06/28/2018   LDLCALC 117 (H) 06/28/2018   TRIG 112.0 06/28/2018   CHOLHDL 4 06/28/2018

## 2018-08-06 MED ORDER — POLYETHYLENE GLYCOL 3350 17 GM/SCOOP PO POWD: 17.0000 g | Freq: Every day | ORAL | 0 refills | Status: DC | PRN

## 2018-10-09 ENCOUNTER — Encounter: Payer: Self-pay | Admitting: Internal Medicine

## 2018-11-23 DIAGNOSIS — D126 Benign neoplasm of colon, unspecified: Secondary | ICD-10-CM

## 2018-11-26 NOTE — Addendum Note (Signed)
Addended by: Crecencio Mc on: 11/26/2018 10:17 PM   Modules accepted: Orders

## 2019-01-01 ENCOUNTER — Telehealth: Payer: Self-pay

## 2019-01-01 LAB — HM COLONOSCOPY

## 2019-01-01 NOTE — Telephone Encounter (Signed)
Copied from [place] [id number]. Topic: General - Other >> Jan 01, 2019 11:13 AM Celene Kras A wrote: Reason for CRM: Physician called stating he needed to speak with PCP regarding pts colonoscopy. Physician was very limited with information.  Please advise.

## 2019-01-01 NOTE — Telephone Encounter (Signed)
Gave Dr. Derrel Nip the number to call the physician back on.

## 2019-01-02 ENCOUNTER — Telehealth: Payer: Self-pay

## 2019-01-02 NOTE — Telephone Encounter (Signed)
Spoke with pt and he stated that he is feeling fine and that his heart has not dropped any more. The pt stated that his normal heart rate is in the upper 50s to low 60s. Pt is scheduled for a virtual visit on Monday at 2:30pm.

## 2019-01-06 ENCOUNTER — Other Ambulatory Visit: Payer: Self-pay

## 2019-01-06 ENCOUNTER — Encounter: Payer: Self-pay | Admitting: Internal Medicine

## 2019-01-06 ENCOUNTER — Ambulatory Visit (INDEPENDENT_AMBULATORY_CARE_PROVIDER_SITE_OTHER): Payer: BC Managed Care – PPO | Admitting: Internal Medicine

## 2019-01-06 VITALS — BP 106/71 | HR 61 | Ht 74.25 in | Wt 193.0 lb

## 2019-01-06 DIAGNOSIS — R001 Bradycardia, unspecified: Secondary | ICD-10-CM

## 2019-01-06 NOTE — Progress Notes (Signed)
Virtual Visit via [email or web address]  This visit type was conducted due to national recommendations for restrictions regarding the COVID-19 pandemic (e.g. social distancing).  This format is felt to be most appropriate for this patient at this time.  All issues noted in this document were discussed and addressed.  No physical exam was performed (except for noted visual exam findings with Video Visits).   I connected with@ on 01/06/19 at  2:30 PM EDT by a video enabled telemedicine application and verified that I am speaking with the correct person using two identifiers. Location patient: home Location provider: work or home office Persons participating in the virtual visit: patient, provider  I discussed the limitations, risks, security and privacy concerns of performing an evaluation and management service by telephone and the availability of in person appointments. I also discussed with the patient that there may be a patient responsible charge related to this service. The patient expressed understanding and agreed to proceed.  Reason for visit: referred by dr Gustavo Lah to follow up on an episode of bradycardia during recovery from colonoscopy  HPI:   63 yr old male with no significant PMH presents at the request of Dr Gustavo Lah following an observed episode of sinus bradycardia with rate dipping into the 40's during a  prolonged recovery period following sedation with Diprivan for  A screening colonoscopy . EKG was done and reviewed by Dr Gustavo Lah with reportedly no evidence of heart block.  Patient was asymptomatic ,  bp was normal.    He has felt fine since the procedure on July 8 and has been measuring his pulse and BP daily since then.  Pulse has been regular in rhythm and has  ranged from 61 to 73 and BP 106/70  ,  118/71   Patient's last several in office readings reviewed: he has mild bradycardia .  He used to run for exercise but was never a long distance runner  He has no known cardiac history and  denies any history of chest pain or exertional dyspnea,    ROS: See pertinent positives and negatives per HPI.  Past Medical History:  Diagnosis Date  . Allergy    seasonal  . History of colon polyps     No past surgical history on file.  Family History  Problem Relation Age of Onset  . Hearing loss Mother   . Hypertension Mother   . Hearing loss Father   . Hyperlipidemia Father   . Heart disease Father 22       CAD, 7 vessel bypass at age 23   . Cancer Father   . Heart failure Paternal Grandmother   . Heart attack Paternal Grandfather     SOCIAL HX:  Social History   Social History Narrative   Patient commmutes to [place] daily for work   He relocated to [place] from [place],  To assist his parents in their old age.      Current Outpatient Medications:  .  finasteride (PROPECIA) 1 MG tablet, Take 1 mg by mouth daily., Disp: , Rfl:  .  polyethylene glycol powder (GLYCOLAX/MIRALAX) powder, Take 17 g by mouth daily as needed for mild constipation., Disp: 116 g, Rfl: 0 .  Red Yeast Rice 600 MG CAPS, Take 1,200 mg by mouth daily., Disp: , Rfl:   EXAM:  VITALS per patient if applicable:  GENERAL: alert, oriented, appears well and in no acute distress   HEENT: atraumatic, conjunttiva clear, no obvious abnormalities on inspection of external nose and ears  NECK: normal movements of the head and neck  LUNGS: on inspection no signs of respiratory distress, breathing rate appears normal, no obvious gross SOB, gasping or wheezing  CV: no obvious cyanosis  MS: moves all visible extremities without noticeable abnormality  PSYCH/NEURO: pleasant and cooperative, no obvious depression or anxiety, speech and thought processing grossly intact  ASSESSMENT AND PLAN:  Sinus bradycardia seen on cardiac monitor Noted during recovery period from colonoscopy, during which time he had received propofol and had a prolonged recovery time of nearly an hour.  He was not hypoxic or  hypotensive and denies any history of fatigue   Chest pain or exertional dyspnea.  Baseline pulse is 55 to 65 ,  No further workup unless episode recurs.     I discussed the assessment and treatment plan with the patient. The patient was provided an opportunity to ask questions and all were answered. The patient agreed with the plan and demonstrated an understanding of the instructions.   The patient was advised to call back or seek an in-person evaluation if the symptoms worsen or if the condition fails to improve as anticipated.  I provided 25 minutes of non-face-to-face time during this encounter.   Crecencio Mc, MD

## 2019-01-07 ENCOUNTER — Encounter: Payer: Self-pay | Admitting: Internal Medicine

## 2019-01-07 DIAGNOSIS — R001 Bradycardia, unspecified: Secondary | ICD-10-CM | POA: Insufficient documentation

## 2019-01-07 NOTE — Assessment & Plan Note (Signed)
Noted during recovery period from colonoscopy, during which time he had received propofol and had a prolonged recovery time of nearly an hour.  He was not hypoxic or hypotensive and denies any history of fatigue   Chest pain or exertional dyspnea.  Baseline pulse is 55 to 65 ,  No further workup unless episode recurs.

## 2019-02-17 ENCOUNTER — Ambulatory Visit (INDEPENDENT_AMBULATORY_CARE_PROVIDER_SITE_OTHER): Payer: BC Managed Care – PPO

## 2019-02-17 ENCOUNTER — Other Ambulatory Visit: Payer: Self-pay

## 2019-02-17 DIAGNOSIS — Z23 Encounter for immunization: Secondary | ICD-10-CM

## 2019-09-11 ENCOUNTER — Other Ambulatory Visit: Payer: Self-pay

## 2019-09-15 ENCOUNTER — Encounter: Payer: Self-pay | Admitting: Internal Medicine

## 2019-09-15 ENCOUNTER — Other Ambulatory Visit: Payer: Self-pay

## 2019-09-15 ENCOUNTER — Ambulatory Visit (INDEPENDENT_AMBULATORY_CARE_PROVIDER_SITE_OTHER): Payer: BC Managed Care – PPO | Admitting: Internal Medicine

## 2019-09-15 VITALS — BP 138/86 | HR 67 | Temp 98.2°F | Ht 74.25 in | Wt 182.2 lb

## 2019-09-15 DIAGNOSIS — Z0000 Encounter for general adult medical examination without abnormal findings: Secondary | ICD-10-CM

## 2019-09-15 DIAGNOSIS — Z0001 Encounter for general adult medical examination with abnormal findings: Secondary | ICD-10-CM | POA: Diagnosis not present

## 2019-09-15 DIAGNOSIS — R609 Edema, unspecified: Secondary | ICD-10-CM

## 2019-09-15 DIAGNOSIS — R5383 Other fatigue: Secondary | ICD-10-CM | POA: Diagnosis not present

## 2019-09-15 DIAGNOSIS — G2581 Restless legs syndrome: Secondary | ICD-10-CM

## 2019-09-15 DIAGNOSIS — E785 Hyperlipidemia, unspecified: Secondary | ICD-10-CM

## 2019-09-15 DIAGNOSIS — N138 Other obstructive and reflux uropathy: Secondary | ICD-10-CM

## 2019-09-15 DIAGNOSIS — Z125 Encounter for screening for malignant neoplasm of prostate: Secondary | ICD-10-CM

## 2019-09-15 DIAGNOSIS — N401 Enlarged prostate with lower urinary tract symptoms: Secondary | ICD-10-CM

## 2019-09-15 NOTE — Progress Notes (Signed)
Patient ID: Bobby Mills, male    DOB: March 30, 1956  Age: 64 y.o. MRN: [id number]  The patient is here for annual PREVENTIVE  examination and management of other chronic and acute problems.  This visit occurred during the SARS-CoV-2 public health emergency.  Safety protocols were in place, including screening questions prior to the visit, additional usage of staff PPE, and extensive cleaning of exam room while observing appropriate contact time as indicated for disinfecting solutions.     The risk factors are reflected in the social history.  The roster of all physicians providing medical care to patient - is listed in the Snapshot section of the chart.  Activities of daily living:  The patient is 100% independent in all ADLs: dressing, toileting, feeding as well as independent mobility  Home safety : The patient has smoke detectors in the home. They wear seatbelts.  There are no firearms at home. There is no violence in the home.   There is no risks for hepatitis, STDs or HIV. There is no   history of blood transfusion. They have no travel history to infectious disease endemic areas of the world.  The patient has seen their dentist in the last six month. They have seen their eye doctor in the last year. They admit to slight hearing difficulty with regard to whispered voices and some television programs.  They have deferred audiologic testing in the last year.  They do not  have excessive sun exposure. Discussed the need for sun protection: hats, long sleeves and use of sunscreen if there is significant sun exposure.   Diet: the importance of a healthy diet is discussed. They do have a healthy diet.  The benefits of regular aerobic exercise were discussed. She walks 4 times per week ,  20 minutes.   Depression screen: there are no signs or vegative symptoms of depression- irritability, change in appetite, anhedonia, sadness/tearfullness.  Cognitive assessment: the patient manages all their  financial and personal affairs and is actively engaged. They could relate day,date,year and events; recalled 2/3 objects at 3 minutes; performed clock-face test normally.  The following portions of the patient's history were reviewed and updated as appropriate: allergies, current medications, past family history, past medical history,  past surgical history, past social history  and problem list.  Visual acuity was not assessed per patient preference since she has regular follow up with her ophthalmologist. Hearing and body mass index were assessed and reviewed.   During the course of the visit the patient was educated and counseled about appropriate screening and preventive services including : fall prevention , diabetes screening, nutrition counseling, colorectal cancer screening, and recommended immunizations.    CC: The primary encounter diagnosis was Fatigue, unspecified type. Diagnoses of Restless legs syndrome, Prostate cancer screening, Hyperlipidemia LDL goal <130, Edema, unspecified type, Encounter for preventive health examination, and BPH with obstruction/lower urinary tract symptoms were also pertinent to this visit.  1) urinary hesitancy,  Chronic,  Slightly weakened stream.  Taking Finasteride. Discussed Urology referral , pending his choice of providers.   2) Hyperlipidemia:  Taking red yeast rice 1200 mg daily  3) Lean BMI.  With fatigue,  Low muscle mass,  FH of osteoporosis.  Checking testosterone level today   4) ankle edema,  Worse by end of day,  bilateral  Patient has received both doses of the Twilight 19 vaccine without complications.  Patient continues to mask when outside of the home except when walking in yard or at safe distances  from others .  Patient denies any change in mood or development of unhealthy behaviors resuting from the pandemic's restriction of activities and socialization  History Zylan has a past medical history of Allergy and History of colon  polyps.   He has no past surgical history on file.   His family history includes Cancer in his father; Hearing loss in his father and mother; Heart attack in his paternal grandfather; Heart disease (age of onset: 40) in his father; Heart failure in his paternal grandmother; Hyperlipidemia in his father; Hypertension in his mother.He reports that he has quit smoking. His smoking use included cigarettes. He has a 3.00 pack-year smoking history. He has never used smokeless tobacco. He reports that he does not drink alcohol or use drugs.  Outpatient Medications Prior to Visit  Medication Sig Dispense Refill  . finasteride (PROPECIA) 1 MG tablet Take 1 mg by mouth daily.    . Red Yeast Rice 600 MG CAPS Take 1,200 mg by mouth daily.    . polyethylene glycol powder (GLYCOLAX/MIRALAX) powder Take 17 g by mouth daily as needed for mild constipation. 116 g 0   No facility-administered medications prior to visit.    Review of Systems   Patient denies headache, fevers, malaise, unintentional weight loss, skin rash, eye pain, sinus congestion and sinus pain, sore throat, dysphagia,  hemoptysis , cough, dyspnea, wheezing, chest pain, palpitations, orthopnea, edema, abdominal pain, nausea, melena, diarrhea, constipation, flank pain, dysuria, hematuria, urinary  Frequency, nocturia, numbness, tingling, seizures,  Focal weakness, Loss of consciousness,  Tremor, insomnia, depression, anxiety, and suicidal ideation.      Objective:  BP 138/86   Pulse 67   Temp 98.2 F (36.8 C) (Temporal)   Ht 6' 2.25" (1.886 m)   Wt 182 lb 3.2 oz (82.6 kg)   SpO2 98%   BMI 23.24 kg/m   Physical Exam  General appearance: alert, cooperative and appears stated age Ears: normal TM's and external ear canals both ears Throat: lips, mucosa, and tongue normal; teeth and gums normal Neck: no adenopathy, no carotid bruit, supple, symmetrical, trachea midline and thyroid not enlarged, symmetric, no  tenderness/mass/nodules Back: symmetric, no curvature. ROM normal. No CVA tenderness. Lungs: clear to auscultation bilaterally Heart: regular rate and rhythm, S1, S2 normal, no murmur, click, rub or gallop Abdomen: soft, non-tender; bowel sounds normal; no masses,  no organomegaly Pulses: 2+ and symmetric Skin: 1+ pitting edema bilaterally with spider veins covering lower third of legs. Skin color, texture, turgor normal. No rashes or lesions Lymph nodes: Cervical, supraclavicular, and axillary nodes normal.   Assessment & Plan:   Problem List Items Addressed This Visit      Unprioritized   Encounter for preventive health examination    age appropriate education and counseling updated, referrals for preventative services and immunizations addressed, dietary and smoking counseling addressed, most recent labs reviewed.  I have personally reviewed and have noted:  1) the patient's medical and social history 2) The pt's use of alcohol, tobacco, and illicit drugs 3) The patient's current medications and supplements 4) Functional ability including ADL's, fall risk, home safety risk, hearing and visual impairment 5) Diet and physical activities 6) Evidence for depression or mood disorder 7) The patient's height, weight, and BMI have been recorded in the chart  I have made referrals, and provided counseling and education based on review of the above      Hyperlipidemia LDL goal <130   Relevant Orders   Lipid panel   BPH  with obstruction/lower urinary tract symptoms    Continue finasteride.  Referral to urology for evaluation in progress       Prostate cancer screening    Annual PSAs have been very low.   Lab Results  Component Value Date   PSA 0.13 06/28/2018   PSA 0.13 06/22/2017          Relevant Orders   PSA   Fatigue - Primary    With lean body mass and inability to build muscle despite exercise.  Checking thyroid and testosterone levels        Relevant Orders    Testosterone   Comprehensive metabolic panel   CBC with Differential/Platelet   TSH   Edema    Ruling out nephrotic syndrome and thyroid.  No clinical signs of chf.  Suspect VI.  Compression socks advised.       Relevant Orders   Microalbumin / creatinine urine ratio    Other Visit Diagnoses    Restless legs syndrome       Relevant Orders   IBC + Ferritin      I am having Bing Quarry "Ed" maintain his finasteride, Red Yeast Rice, and polyethylene glycol powder.  No orders of the defined types were placed in this encounter.   There are no discontinued medications.  Follow-up: No follow-ups on file.   Crecencio Mc, MD

## 2019-09-15 NOTE — Patient Instructions (Addendum)
REFERRAL TO UROLOGY OFFERED, LET ME KNOW WHO YOU WOULD LIKE TO SEE   VENOUS INSUFFICIENCY IS THE LIKELY CAUSE  OF YOUR LEG SWELLING  [email or web address] PROVIDES A GREAT VARIETY OF COMPRESSION SOCKINGS IN VARIOUS STYLES    CHECK WITH INSURANCE ABOUT BONE DENSITY SCREENING  YOU RECEIVED A TDAP VACCINE TODAY (GOOD FOR TEN YEARS )   Chronic Venous Insufficiency Chronic venous insufficiency is a condition where the leg veins cannot effectively pump blood from the legs to the heart. This happens when the vein walls are either stretched, weakened, or damaged, or when the valves inside the vein are damaged. With the right treatment, you should be able to continue with an active life. This condition is also called venous stasis. What are the causes? Common causes of this condition include:  High blood pressure inside the veins (venous hypertension).  Sitting or standing too long, causing increased blood pressure in the leg veins.  A blood clot that blocks blood flow in a vein (deep vein thrombosis, DVT).  Inflammation of a vein (phlebitis) that causes a blood clot to form.  Tumors in the pelvis that cause blood to back up. What increases the risk? The following factors may make you more likely to develop this condition:  Having a family history of this condition.  Obesity.  Pregnancy.  Living without enough regular physical activity or exercise (sedentary lifestyle).  Smoking.  Having a job that requires long periods of standing or sitting in one place.  Being a certain age. Women in their 41s and 7s and men in their 19s are more likely to develop this condition. What are the signs or symptoms? Symptoms of this condition include:  Veins that are enlarged, bulging, or twisted (varicose veins).  Skin breakdown or ulcers.  Reddened skin or dark discoloration of skin on the leg between the knee and ankle.  Brown, smooth, tight, and painful skin just above the ankle, usually on the  inside of the leg (lipodermatosclerosis).  Swelling of the legs. How is this diagnosed? This condition may be diagnosed based on:  Your medical history.  A physical exam.  Tests, such as: ? A procedure that creates an image of a blood vessel and nearby organs and provides information about blood flow through the blood vessel (duplex ultrasound). ? A procedure that tests blood flow (plethysmography). ? A procedure that looks at the veins using X-ray and dye (venogram). How is this treated? The goals of treatment are to help you return to an active life and to minimize pain or disability. Treatment depends on the severity of your condition, and it may include:  Wearing compression stockings. These can help relieve symptoms and help prevent your condition from getting worse. However, they do not cure the condition.  Sclerotherapy. This procedure involves an injection of a solution that shrinks damaged veins.  Surgery. This may involve: ? Removing a diseased vein (vein stripping). ? Cutting off blood flow through the vein (laser ablation surgery). ? Repairing or reconstructing a valve within the affected vein. Follow these instructions at home:      Wear compression stockings as told by your health care provider. These stockings help to prevent blood clots and reduce swelling in your legs.  Take over-the-counter and prescription medicines only as told by your health care provider.  Stay active by exercising, walking, or doing different activities. Ask your health care provider what activities are safe for you and how much exercise you need.  Drink enough fluid to  keep your urine pale yellow.  Do not use any products that contain nicotine or tobacco, such as cigarettes, e-cigarettes, and chewing tobacco. If you need help quitting, ask your health care provider.  Keep all follow-up visits as told by your health care provider. This is important. Contact a health care provider if  you:  Have redness, swelling, or more pain in the affected area.  See a red streak or line that goes up or down from the affected area.  Have skin breakdown or skin loss in the affected area, even if the breakdown is small.  Get an injury in the affected area. Get help right away if:  You get an injury and an open wound in the affected area.  You have: ? Severe pain that does not get better with medicine. ? Sudden numbness or weakness in the foot or ankle below the affected area. ? Trouble moving your foot or ankle. ? A fever. ? Worse or persistent symptoms. ? Chest pain. ? Shortness of breath. Summary  Chronic venous insufficiency is a condition where the leg veins cannot effectively pump blood from the legs to the heart.  Chronic venous insufficiency occurs when the vein walls become stretched, weakened, or damaged, or when valves within the vein are damaged.  Treatment depends on how severe your condition is. It often involves wearing compression stockings and may involve having a procedure.  Make sure you stay active by exercising, walking, or doing different activities. Ask your health care provider what activities are safe for you and how much exercise you need. This information is not intended to replace advice given to you by your health care provider. Make sure you discuss any questions you have with your health care provider. Document Revised: 02/26/2018 Document Reviewed: 02/26/2018 Elsevier Patient Education  [employer].

## 2019-09-16 ENCOUNTER — Encounter: Payer: Self-pay | Admitting: Internal Medicine

## 2019-09-16 DIAGNOSIS — N138 Other obstructive and reflux uropathy: Secondary | ICD-10-CM | POA: Insufficient documentation

## 2019-09-16 DIAGNOSIS — Z125 Encounter for screening for malignant neoplasm of prostate: Secondary | ICD-10-CM | POA: Insufficient documentation

## 2019-09-16 DIAGNOSIS — R609 Edema, unspecified: Secondary | ICD-10-CM | POA: Insufficient documentation

## 2019-09-16 DIAGNOSIS — N401 Enlarged prostate with lower urinary tract symptoms: Secondary | ICD-10-CM | POA: Insufficient documentation

## 2019-09-16 DIAGNOSIS — R5383 Other fatigue: Secondary | ICD-10-CM | POA: Insufficient documentation

## 2019-09-16 NOTE — Assessment & Plan Note (Signed)
With lean body mass and inability to build muscle despite exercise.  Checking thyroid and testosterone levels

## 2019-09-16 NOTE — Assessment & Plan Note (Signed)
Continue finasteride.  Referral to urology for evaluation in progress

## 2019-09-16 NOTE — Assessment & Plan Note (Signed)
Ruling out nephrotic syndrome and thyroid.  No clinical signs of chf.  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  Compression socks advised.

## 2019-09-16 NOTE — Assessment & Plan Note (Signed)

## 2019-09-16 NOTE — Assessment & Plan Note (Signed)
Annual PSAs have been very low.   Lab Results  Component Value Date   PSA 0.13 06/28/2018   PSA 0.13 06/22/2017

## 2019-09-23 ENCOUNTER — Other Ambulatory Visit (INDEPENDENT_AMBULATORY_CARE_PROVIDER_SITE_OTHER): Payer: BC Managed Care – PPO

## 2019-09-23 ENCOUNTER — Other Ambulatory Visit: Payer: Self-pay

## 2019-09-23 DIAGNOSIS — G2581 Restless legs syndrome: Secondary | ICD-10-CM

## 2019-09-23 DIAGNOSIS — R5383 Other fatigue: Secondary | ICD-10-CM

## 2019-09-23 DIAGNOSIS — Z125 Encounter for screening for malignant neoplasm of prostate: Secondary | ICD-10-CM | POA: Diagnosis not present

## 2019-09-23 DIAGNOSIS — R609 Edema, unspecified: Secondary | ICD-10-CM | POA: Diagnosis not present

## 2019-09-23 DIAGNOSIS — E785 Hyperlipidemia, unspecified: Secondary | ICD-10-CM | POA: Diagnosis not present

## 2019-09-23 DIAGNOSIS — Z13820 Encounter for screening for osteoporosis: Secondary | ICD-10-CM

## 2019-09-23 LAB — LIPID PANEL
Cholesterol: 159 mg/dL (ref 0–200)
HDL: 45 mg/dL (ref >39.00)
LDL Cholesterol: 99 mg/dL (ref 0–99)
NonHDL: 113.68
Total CHOL/HDL Ratio: 4
Triglycerides: 73 mg/dL (ref 0.0–149.0)
VLDL: 14.6 mg/dL (ref 0.0–40.0)

## 2019-09-23 LAB — CBC WITH DIFFERENTIAL/PLATELET
Basophils Absolute: 0.1 10*3/uL (ref 0.0–0.1)
Basophils Relative: 1.1 % (ref 0.0–3.0)
Eosinophils Absolute: 0.1 10*3/uL (ref 0.0–0.7)
Eosinophils Relative: 2.4 % (ref 0.0–5.0)
HCT: 46.4 % (ref 39.0–52.0)
Hemoglobin: 16 g/dL (ref 13.0–17.0)
Lymphocytes Relative: 31.4 % (ref 12.0–46.0)
Lymphs Abs: 1.8 10*3/uL (ref 0.7–4.0)
MCHC: 34.4 g/dL (ref 30.0–36.0)
MCV: 94.9 fl (ref 78.0–100.0)
Monocytes Absolute: 0.7 10*3/uL (ref 0.1–1.0)
Monocytes Relative: 12.2 % — ABNORMAL HIGH (ref 3.0–12.0)
Neutro Abs: 3.1 10*3/uL (ref 1.4–7.7)
Neutrophils Relative %: 52.9 % (ref 43.0–77.0)
Platelets: 172 10*3/uL (ref 150.0–400.0)
RBC: 4.89 Mil/uL (ref 4.22–5.81)
RDW: 13.4 % (ref 11.5–15.5)
WBC: 5.8 10*3/uL (ref 4.0–10.5)

## 2019-09-23 LAB — MICROALBUMIN / CREATININE URINE RATIO
Creatinine,U: 223.1 mg/dL
Microalb Creat Ratio: 0.4 mg/g (ref 0.0–30.0)
Microalb, Ur: 0.9 mg/dL (ref 0.0–1.9)

## 2019-09-23 LAB — COMPREHENSIVE METABOLIC PANEL
ALT: 13 U/L (ref 0–53)
AST: 19 U/L (ref 0–37)
Albumin: 4.2 g/dL (ref 3.5–5.2)
Alkaline Phosphatase: 66 U/L (ref 39–117)
BUN: 16 mg/dL (ref 6–23)
CO2: 29 mEq/L (ref 19–32)
Calcium: 9.1 mg/dL (ref 8.4–10.5)
Chloride: 105 mEq/L (ref 96–112)
Creatinine, Ser: 0.9 mg/dL (ref 0.40–1.50)
GFR: 85.28 mL/min (ref >60.00)
Glucose, Bld: 85 mg/dL (ref 70–99)
Potassium: 4.5 mEq/L (ref 3.5–5.1)
Sodium: 140 mEq/L (ref 135–145)
Total Bilirubin: 0.8 mg/dL (ref 0.2–1.2)
Total Protein: 6.4 g/dL (ref 6.0–8.3)

## 2019-09-23 LAB — TESTOSTERONE: Testosterone: 558.65 ng/dL (ref 300.00–890.00)

## 2019-09-23 LAB — IBC + FERRITIN
Ferritin: 156.1 ng/mL (ref 22.0–322.0)
Iron: 87 ug/dL (ref 42–165)
Saturation Ratios: 35.7 % (ref 20.0–50.0)
Transferrin: 174 mg/dL — ABNORMAL LOW (ref 212.0–360.0)

## 2019-09-23 LAB — TSH: TSH: 2.05 u[IU]/mL (ref 0.35–4.50)

## 2019-09-23 LAB — PSA: PSA: 0.09 ng/mL — ABNORMAL LOW (ref 0.10–4.00)

## 2019-10-13 ENCOUNTER — Ambulatory Visit
Admission: RE | Admit: 2019-10-13 | Discharge: 2019-10-13 | Disposition: A | Discharge status: Home / Self Care | Payer: BC Managed Care – PPO | Source: Ambulatory Visit | Attending: Internal Medicine | Admitting: Internal Medicine

## 2019-10-13 DIAGNOSIS — Z13820 Encounter for screening for osteoporosis: Secondary | ICD-10-CM | POA: Insufficient documentation

## 2020-02-17 ENCOUNTER — Telehealth: Payer: Self-pay

## 2020-02-17 NOTE — Telephone Encounter (Signed)
TDAP

## 2020-02-17 NOTE — Telephone Encounter (Signed)
LMTCB. Need to schedule pt for a nurse visit to get his Tdap vaccine.

## 2020-02-17 NOTE — Telephone Encounter (Signed)
Pt would like to get a tetanus vaccine. Do not have one documented in chart. Would you like for him to have Td or Tdap?

## 2020-02-18 NOTE — Telephone Encounter (Signed)
Pt has been scheduled.  °

## 2020-02-25 ENCOUNTER — Other Ambulatory Visit: Payer: Self-pay

## 2020-02-25 ENCOUNTER — Ambulatory Visit (INDEPENDENT_AMBULATORY_CARE_PROVIDER_SITE_OTHER): Payer: BC Managed Care – PPO

## 2020-02-25 DIAGNOSIS — Z23 Encounter for immunization: Secondary | ICD-10-CM

## 2020-02-25 NOTE — Progress Notes (Signed)
Patient presented for TDAP injection to left deltoid, patient voiced no concerns nor showed any signs of distress during injection. 

## 2020-09-16 ENCOUNTER — Ambulatory Visit (INDEPENDENT_AMBULATORY_CARE_PROVIDER_SITE_OTHER): Payer: BC Managed Care – PPO | Admitting: Internal Medicine

## 2020-09-16 ENCOUNTER — Other Ambulatory Visit: Payer: Self-pay

## 2020-09-16 ENCOUNTER — Encounter: Payer: Self-pay | Admitting: Internal Medicine

## 2020-09-16 VITALS — BP 112/86 | HR 61 | Temp 98.1°F | Resp 15 | Ht 74.25 in | Wt 181.2 lb

## 2020-09-16 DIAGNOSIS — Z0000 Encounter for general adult medical examination without abnormal findings: Secondary | ICD-10-CM

## 2020-09-16 DIAGNOSIS — Z125 Encounter for screening for malignant neoplasm of prostate: Secondary | ICD-10-CM | POA: Diagnosis not present

## 2020-09-16 DIAGNOSIS — E785 Hyperlipidemia, unspecified: Secondary | ICD-10-CM | POA: Diagnosis not present

## 2020-09-16 MED ORDER — ALPRAZOLAM 0.25 MG PO TABS
0.1250 mg | ORAL_TABLET | Freq: Every day | ORAL | 0 refills | Status: DC | PRN
Start: 2020-09-16 — End: 2022-03-02

## 2020-09-16 NOTE — Progress Notes (Signed)
Patient ID: Bobby Mills, male    DOB: Dec 31, 1955  Age: 66 y.o. MRN: [id number]  The patient is here for annual  preventive examination and management of other chronic and acute problems.  This visit occurred during the SARS-CoV-2 public health emergency.  Safety protocols were in place, including screening questions prior to the visit, additional usage of staff PPE, and extensive cleaning of exam room while observing appropriate contact time as indicated for disinfecting solutions.      The risk factors are reflected in the social history.  The roster of all physicians providing medical care to patient - is listed in the Snapshot section of the chart.  Activities of daily living:  The patient is 100% independent in all ADLs: dressing, toileting, feeding as well as independent mobility  Home safety : The patient has smoke detectors in the home. They wear seatbelts.  There are no firearms at home. There is no violence in the home.   There is no risks for hepatitis, STDs or HIV. There is no   history of blood transfusion. They have no travel history to infectious disease endemic areas of the world.  The patient has seen their dentist in the last six month. They have seen their eye doctor in the last year. He denies hearing difficulty with regard to whispered voices and some television programs.  They have deferred audiologic testing in the last year.  They do not  have excessive sun exposure. Discussed the need for sun protection: hats, long sleeves and use of sunscreen if there is significant sun exposure.   Diet: the importance of a healthy diet is discussed. They do have a healthy diet.  The benefits of regular aerobic exercise were discussed. She walks 4 times per week ,  20 minutes.   Depression screen: there are no signs or vegative symptoms of depression- irritability, change in appetite, anhedonia, sadness/tearfullness.  Cognitive assessment: the patient manages all their financial and  personal affairs and is actively engaged. They could relate day,date,year and events; recalled 2/3 objects at 3 minutes; performed clock-face test normally.  The following portions of the patient's history were reviewed and updated as appropriate: allergies, current medications, past family history, past medical history,  past surgical history, past social history  and problem list.  Visual acuity was not assessed per patient preference since she has regular follow up with her ophthalmologist. Hearing and body mass index were assessed and reviewed.   During the course of the visit the patient was educated and counseled about appropriate screening and preventive services including : fall prevention , diabetes screening, nutrition counseling, colorectal cancer screening, and recommended immunizations.    CC: The primary encounter diagnosis was Prostate cancer screening. Diagnoses of Hyperlipidemia LDL goal <130 and Encounter for preventive health examination were also pertinent to this visit.  1) Derm:  Basal cell CA removed from right forearm.  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  ("torture") had redness and discomfort for over a week.  May want a second opinion on treatment with [facility].   History Bobby Mills has a past medical history of Allergy and History of colon polyps.   He has no past surgical history on file.   His family history includes Cancer in his father; Hearing loss in his father and mother; Heart attack in his paternal grandfather; Heart disease (age of onset: 58) in his father; Heart failure in his  paternal grandmother; Hyperlipidemia in his father; Hypertension in his mother.He reports that he has quit smoking. His smoking use included cigarettes. He has a 3.00 pack-year smoking history. He has never used smokeless tobacco. He reports that he does not drink alcohol and does not use drugs.  Outpatient Medications Prior to  Visit  Medication Sig Dispense Refill  . finasteride (PROPECIA) 1 MG tablet Take 1 mg by mouth daily.    . Red Yeast Rice 600 MG CAPS Take 1,200 mg by mouth daily.    Marland Kitchen triamcinolone (KENALOG) 0.1 % Apply topically 2 (two) times daily.    . polyethylene glycol powder (GLYCOLAX/MIRALAX) powder Take 17 g by mouth daily as needed for mild constipation. 116 g 0   No facility-administered medications prior to visit.    Review of Systems  Patient denies headache, fevers, malaise, unintentional weight loss, skin rash, eye pain, sinus congestion and sinus pain, sore throat, dysphagia,  hemoptysis , cough, dyspnea, wheezing, chest pain, palpitations, orthopnea, edema, abdominal pain, nausea, melena, diarrhea, constipation, flank pain, dysuria, hematuria, urinary  Frequency, nocturia, numbness, tingling, seizures,  Focal weakness, Loss of consciousness,  Tremor, insomnia, depression, anxiety, and suicidal ideation.     Objective:  BP 112/86 (BP Location: Left Arm, Patient Position: Sitting, Cuff Size: Normal)   Pulse 61   Temp 98.1 F (36.7 C) (Oral)   Resp 15   Ht 6' 2.25" (1.886 m)   Wt 181 lb 3.2 oz (82.2 kg)   SpO2 99%   BMI 23.11 kg/m   Physical Exam  General appearance: alert, cooperative and appears stated age Ears: normal TM's and external ear canals both ears Throat: lips, mucosa, and tongue normal; teeth and gums normal Neck: no adenopathy, no carotid bruit, supple, symmetrical, trachea midline and thyroid not enlarged, symmetric, no tenderness/mass/nodules Back: symmetric, no curvature. ROM normal. No CVA tenderness. Lungs: clear to auscultation bilaterally Heart: regular rate and rhythm, S1, S2 normal, no murmur, click, rub or gallop Abdomen: soft, non-tender; bowel sounds normal; no masses,  no organomegaly Pulses: 2+ and symmetric Skin: Skin color, texture, turgor normal. No rashes or lesions Lymph nodes: Cervical, supraclavicular, and axillary nodes normal.   Assessment  & Plan:   Problem List Items Addressed This Visit      Unprioritized   Hyperlipidemia LDL goal <130   Relevant Orders   Comprehensive metabolic panel   Lipid panel   Encounter for preventive health examination    age appropriate education and counseling updated, referrals for preventative services and immunizations addressed, dietary and smoking counseling addressed, most recent labs reviewed.  I have personally reviewed and have noted:  1) the patient's medical and social history 2) The pt's use of alcohol, tobacco, and illicit drugs 3) The patient's current medications and supplements 4) Functional ability including ADL's, fall risk, home safety risk, hearing and visual impairment 5) Diet and physical activities 6) Evidence for depression or mood disorder 7) The patient's height, weight, and BMI have been recorded in the chart  I have made referrals, and provided counseling and education based on review of the above      Prostate cancer screening - Primary   Relevant Orders   PSA      I have discontinued Bing Quarry "Ed"'s polyethylene glycol powder. I am also having him start on ALPRAZolam. Additionally, I am having him maintain his finasteride, Red Yeast Rice, and triamcinolone.  Meds ordered this encounter  Medications  . ALPRAZolam (XANAX) 0.25 MG tablet    Sig:  Take 0.5 tablets (0.125 mg total) by mouth daily as needed for anxiety or sleep.    Dispense:  20 tablet    Refill:  0    Medications Discontinued During This Encounter  Medication Reason  . polyethylene glycol powder (GLYCOLAX/MIRALAX) powder     Follow-up: Return in about 1 week (around 09/23/2020).   Crecencio Mc, MD

## 2020-09-16 NOTE — Patient Instructions (Addendum)
Alprazolam can be used if needed for 2 am wakeups.  Start with 1/2 tablet  Try taking your melatonin at dinner time to help reproduce normal melatonin levels   I am happy to prescribe a prostate flow medication if needed     The new "normal"  for  blood pressure is now 120/70, but you may have "white coat hypertension."  Please check your blood pressure a few times at home and send me the readings   Return for fasting labs in 1 or more weeks    Health Maintenance, Male Adopting a healthy lifestyle and getting preventive care are important in promoting health and wellness. Ask your health care provider about:  The right schedule for you to have regular tests and exams.  Things you can do on your own to prevent diseases and keep yourself healthy. What should I know about diet, weight, and exercise? Eat a healthy diet  Eat a diet that includes plenty of vegetables, fruits, low-fat dairy products, and lean protein.  Do not eat a lot of foods that are high in solid fats, added sugars, or sodium.   Maintain a healthy weight Body mass index (BMI) is a measurement that can be used to identify possible weight problems. It estimates body fat based on height and weight. Your health care provider can help determine your BMI and help you achieve or maintain a healthy weight. Get regular exercise Get regular exercise. This is one of the most important things you can do for your health. Most adults should:  Exercise for at least 150 minutes each week. The exercise should increase your heart rate and make you sweat (moderate-intensity exercise).  Do strengthening exercises at least twice a week. This is in addition to the moderate-intensity exercise.  Spend less time sitting. Even light physical activity can be beneficial. Watch cholesterol and blood lipids Have your blood tested for lipids and cholesterol at 64 years of age, then have this test every 5 years. You may need to have your  cholesterol levels checked more often if:  Your lipid or cholesterol levels are high.  You are older than 64 years of age.  You are at high risk for heart disease. What should I know about cancer screening? Many types of cancers can be detected early and may often be prevented. Depending on your health history and family history, you may need to have cancer screening at various ages. This may include screening for:  Colorectal cancer.  Prostate cancer.  Skin cancer.  Lung cancer. What should I know about heart disease, diabetes, and high blood pressure? Blood pressure and heart disease  High blood pressure causes heart disease and increases the risk of stroke. This is more likely to develop in people who have high blood pressure readings, are of African descent, or are overweight.  Talk with your health care provider about your target blood pressure readings.  Have your blood pressure checked: ? Every 3-5 years if you are 46-23 years of age. ? Every year if you are 27 years old or older.  If you are between the ages of 59 and 76 and are a current or former smoker, ask your health care provider if you should have a one-time screening for abdominal aortic aneurysm (AAA). Diabetes Have regular diabetes screenings. This checks your fasting blood sugar level. Have the screening done:  Once every three years after age 33 if you are at a normal weight and have a low risk for diabetes.  More often and at a younger age if you are overweight or have a high risk for diabetes. What should I know about preventing infection? Hepatitis B If you have a higher risk for hepatitis B, you should be screened for this virus. Talk with your health care provider to find out if you are at risk for hepatitis B infection. Hepatitis C Blood testing is recommended for:  Everyone born from [birth year] through 1964.  Anyone with known risk factors for hepatitis C. Sexually transmitted infections (STIs)  You  should be screened each year for STIs, including gonorrhea and chlamydia, if: ? You are sexually active and are younger than 64 years of age. ? You are older than 64 years of age and your health care provider tells you that you are at risk for this type of infection. ? Your sexual activity has changed since you were last screened, and you are at increased risk for chlamydia or gonorrhea. Ask your health care provider if you are at risk.  Ask your health care provider about whether you are at high risk for HIV. Your health care provider may recommend a prescription medicine to help prevent HIV infection. If you choose to take medicine to prevent HIV, you should first get tested for HIV. You should then be tested every 3 months for as long as you are taking the medicine. Follow these instructions at home: Lifestyle  Do not use any products that contain nicotine or tobacco, such as cigarettes, e-cigarettes, and chewing tobacco. If you need help quitting, ask your health care provider.  Do not use street drugs.  Do not share needles.  Ask your health care provider for help if you need support or information about quitting drugs. Alcohol use  Do not drink alcohol if your health care provider tells you not to drink.  If you drink alcohol: ? Limit how much you have to 0-2 drinks a day. ? Be aware of how much alcohol is in your drink. In the [place] one drink equals one 12 oz bottle of beer (355 mL), one 5 oz glass of wine (148 mL), or one 1 oz glass of hard liquor (44 mL). General instructions  Schedule regular health, dental, and eye exams.  Stay current with your vaccines.  Tell your health care provider if: ? You often feel depressed. ? You have ever been abused or do not feel safe at home. Summary  Adopting a healthy lifestyle and getting preventive care are important in promoting health and wellness.  Follow your health care provider's instructions about healthy diet, exercising, and  getting tested or screened for diseases.  Follow your health care provider's instructions on monitoring your cholesterol and blood pressure. This information is not intended to replace advice given to you by your health care provider. Make sure you discuss any questions you have with your health care provider. Document Revised: 05/29/2018 Document Reviewed: 05/29/2018 Elsevier Patient Education  2021 [employer].

## 2020-09-18 NOTE — Assessment & Plan Note (Signed)

## 2020-09-24 ENCOUNTER — Other Ambulatory Visit (INDEPENDENT_AMBULATORY_CARE_PROVIDER_SITE_OTHER): Payer: BC Managed Care – PPO

## 2020-09-24 ENCOUNTER — Other Ambulatory Visit: Payer: Self-pay

## 2020-09-24 DIAGNOSIS — Z125 Encounter for screening for malignant neoplasm of prostate: Secondary | ICD-10-CM

## 2020-09-24 DIAGNOSIS — E785 Hyperlipidemia, unspecified: Secondary | ICD-10-CM

## 2020-09-24 LAB — COMPREHENSIVE METABOLIC PANEL
ALT: 15 U/L (ref 0–53)
AST: 20 U/L (ref 0–37)
Albumin: 4.3 g/dL (ref 3.5–5.2)
Alkaline Phosphatase: 65 U/L (ref 39–117)
BUN: 14 mg/dL (ref 6–23)
CO2: 29 mEq/L (ref 19–32)
Calcium: 9.2 mg/dL (ref 8.4–10.5)
Chloride: 102 mEq/L (ref 96–112)
Creatinine, Ser: 0.9 mg/dL (ref 0.40–1.50)
GFR: 90.7 mL/min (ref >60.00)
Glucose, Bld: 84 mg/dL (ref 70–99)
Potassium: 4 mEq/L (ref 3.5–5.1)
Sodium: 136 mEq/L (ref 135–145)
Total Bilirubin: 0.9 mg/dL (ref 0.2–1.2)
Total Protein: 6.6 g/dL (ref 6.0–8.3)

## 2020-09-24 LAB — LIPID PANEL
Cholesterol: 175 mg/dL (ref 0–200)
HDL: 50.8 mg/dL (ref >39.00)
LDL Cholesterol: 109 mg/dL — ABNORMAL HIGH (ref 0–99)
NonHDL: 124.43
Total CHOL/HDL Ratio: 3
Triglycerides: 75 mg/dL (ref 0.0–149.0)
VLDL: 15 mg/dL (ref 0.0–40.0)

## 2020-09-24 LAB — PSA: PSA: 0.14 ng/mL (ref 0.10–4.00)

## 2020-12-24 ENCOUNTER — Telehealth: Payer: BC Managed Care – PPO | Admitting: Family

## 2021-05-18 ENCOUNTER — Encounter: Payer: Self-pay | Admitting: Internal Medicine

## 2021-06-09 ENCOUNTER — Encounter: Payer: Self-pay | Admitting: Internal Medicine

## 2021-06-17 ENCOUNTER — Encounter: Payer: Self-pay | Admitting: Internal Medicine

## 2021-06-17 ENCOUNTER — Telehealth (INDEPENDENT_AMBULATORY_CARE_PROVIDER_SITE_OTHER): Payer: BC Managed Care – PPO | Admitting: Internal Medicine

## 2021-06-17 VITALS — BP 132/88 | HR 75 | Ht 74.25 in | Wt 180.0 lb

## 2021-06-17 DIAGNOSIS — Z8249 Family history of ischemic heart disease and other diseases of the circulatory system: Secondary | ICD-10-CM

## 2021-06-17 DIAGNOSIS — I10 Essential (primary) hypertension: Secondary | ICD-10-CM | POA: Diagnosis not present

## 2021-06-17 DIAGNOSIS — F5105 Insomnia due to other mental disorder: Secondary | ICD-10-CM | POA: Diagnosis not present

## 2021-06-17 DIAGNOSIS — G2581 Restless legs syndrome: Secondary | ICD-10-CM | POA: Insufficient documentation

## 2021-06-17 DIAGNOSIS — F409 Phobic anxiety disorder, unspecified: Secondary | ICD-10-CM

## 2021-06-17 MED ORDER — HYDROCHLOROTHIAZIDE 12.5 MG PO CAPS: 12.5000 mg | ORAL_CAPSULE | Freq: Every day | ORAL | 0 refills | Status: DC

## 2021-06-17 NOTE — Assessment & Plan Note (Signed)
Aortic ultrasound ordered

## 2021-06-17 NOTE — Assessment & Plan Note (Addendum)
New onset,  Since early November.  No offending meds.  No history of osa.  Starting hctz 12.5 mg aily ,  Return in one week for rn visit for bp check and lab .  Renal artery ultrasound and aortic ultrasound ordered given family history .  Anxiety likely playing a role as well as he admits to worrying constantly about his mother

## 2021-06-17 NOTE — Assessment & Plan Note (Signed)
Using alprazolam prn but not daily. The risks and benefits of benzodiazepine use were discussed with patient today including excessive sedation leading to respiratory depression,  impaired thinking/driving, and addiction.  Patient was advised to avoid concurrent use with alcohol, to use medication only as needed and not to share with others  .

## 2021-06-17 NOTE — Progress Notes (Signed)
Virtual Visit via [place]  Note  This visit type was conducted due to national recommendations for restrictions regarding the COVID-19 pandemic (e.g. social distancing).  This format is felt to be most appropriate for this patient at this time.  All issues noted in this document were discussed and addressed.  No physical exam was performed (except for noted visual exam findings with Video Visits).   I connected withNAME@ on 06/17/21 at 10:00 AM EST by a video enabled telemedicine application  and verified that I am speaking with the correct person using two identifiers. Location patient: home Location provider: work or home office Persons participating in the virtual visit: patient, provider  I discussed the limitations, risks, security and privacy concerns of performing an evaluation and management service by telephone and the availability of in person appointments. I also discussed with the patient that there may be a patient responsible charge related to this service. The patient expressed understanding and agreed to proceed.  Reason for visit: elevated blood pressure readings   HPI:  65 yr old male with no history of hypertension presents with elevated readings since Nov 10.  All home systolic readings have been 130 or higher .  Has not been using decongestants or NSAIDS.  No history of OSA.  Family histor y of mother with difficult to control hypertension and aortic aneurysm.  Both he and mother are quite thin in build/BMI<24 . He has a remote history of tobacco abuse. He reports increased anxiety regarding his condition and his mother's condition and has not been sleeping well due ot excessive worrying.    ROS: See pertinent positives and negatives per HPI.  Past Medical History:  Diagnosis Date   Allergy    seasonal   History of colon polyps     No past surgical history on file.  Family History  Problem Relation Age of Onset   Hearing loss Mother    Hypertension Mother     Hearing loss Father    Hyperlipidemia Father    Heart disease Father 43       CAD, 7 vessel bypass at age 36    Cancer Father    Heart failure Paternal Grandmother    Heart attack Paternal Grandfather     SOCIAL HX:  reports that he has quit smoking. His smoking use included cigarettes. He has a 3.00 pack-year smoking history. He has never used smokeless tobacco. He reports that he does not drink alcohol and does not use drugs.    Current Outpatient Medications:    ALPRAZolam (XANAX) 0.25 MG tablet, Take 0.5 tablets (0.125 mg total) by mouth daily as needed for anxiety or sleep., Disp: 20 tablet, Rfl: 0   finasteride (PROPECIA) 1 MG tablet, Take 1 mg by mouth daily., Disp: , Rfl:    hydrochlorothiazide (MICROZIDE) 12.5 MG capsule, Take 1 capsule (12.5 mg total) by mouth daily., Disp: 30 capsule, Rfl: 0   Red Yeast Rice 600 MG CAPS, Take 1,200 mg by mouth daily., Disp: , Rfl:    triamcinolone (KENALOG) 0.1 %, Apply topically 2 (two) times daily., Disp: , Rfl:   EXAM:  VITALS per patient if applicable:  GENERAL: alert, oriented, appears well and in no acute distress  HEENT: atraumatic, conjunttiva clear, no obvious abnormalities on inspection of external nose and ears  NECK: normal movements of the head and neck  LUNGS: on inspection no signs of respiratory distress, breathing rate appears normal, no obvious gross SOB, gasping or wheezing  CV: no obvious  cyanosis  MS: moves all visible extremities without noticeable abnormality  PSYCH/NEURO: pleasant and cooperative, no obvious depression but endorses and exhibits anxiety, , speech and thought processing grossly intact  ASSESSMENT AND PLAN:  Discussed the following assessment and plan:  Hypertension, unspecified type - Plan: Basic metabolic panel, Microalbumin / creatinine urine ratio, US AORTA, VAS US RENAL ARTERY DUPLEX  Family history of abdominal aortic aneurysm (AAA) - Plan: US AORTA  Hypertension New onset,  Since  early November.  No offending meds.  No history of osa.  Starting hctz 12.5 mg aily ,  Return in one week for rn visit for bp check and lab .  Renal artery ultrasound and aortic ultrasound ordered given family history .  Anxiety likely playing a role as well as he admits to worrying constantly about his mother   Family history of abdominal aortic aneurysm (AAA) Aortic ultrasound ordered     I discussed the assessment and treatment plan with the patient. The patient was provided an opportunity to ask questions and all were answered. The patient agreed with the plan and demonstrated an understanding of the instructions.   The patient was advised to call back or seek an in-person evaluation if the symptoms worsen or if the condition fails to improve as anticipated.   I spent 30 minutes dedicated to the care of this patient on the date of this encounter to include pre-visit review of his medical history,  Face-to-face time with the patient , and post visit ordering of testing and therapeutics.    Crecencio Mc, MD

## 2021-06-23 ENCOUNTER — Ambulatory Visit (INDEPENDENT_AMBULATORY_CARE_PROVIDER_SITE_OTHER): Payer: BC Managed Care – PPO

## 2021-06-23 ENCOUNTER — Other Ambulatory Visit: Payer: Self-pay

## 2021-06-23 DIAGNOSIS — I10 Essential (primary) hypertension: Secondary | ICD-10-CM | POA: Diagnosis not present

## 2021-06-23 LAB — BASIC METABOLIC PANEL
BUN: 16 mg/dL (ref 6–23)
CO2: 32 mEq/L (ref 19–32)
Calcium: 9.5 mg/dL (ref 8.4–10.5)
Chloride: 98 mEq/L (ref 96–112)
Creatinine, Ser: 0.96 mg/dL (ref 0.40–1.50)
GFR: 83.5 mL/min (ref >60.00)
Glucose, Bld: 87 mg/dL (ref 70–99)
Potassium: 3.8 mEq/L (ref 3.5–5.1)
Sodium: 136 mEq/L (ref 135–145)

## 2021-06-23 LAB — MICROALBUMIN / CREATININE URINE RATIO
Creatinine,U: 88.6 mg/dL
Microalb Creat Ratio: 1.1 mg/g (ref 0.0–30.0)
Microalb, Ur: 1 mg/dL (ref 0.0–1.9)

## 2021-06-23 NOTE — Progress Notes (Signed)
Patient here for nurse visit BP check per order from [facility].   Patient reports compliance with prescribed BP medications: yes  Last dose of BP medication:   BP Readings from Last 3 Encounters:  06/23/21 118/78  06/17/21 132/88  09/16/20 112/86   Pulse Readings from Last 3 Encounters:  06/23/21 63  06/17/21 75  09/16/20 61    Per Dr. Derrel Nip:  Patient is to continue his regimen and was informed that his BP machine for home needs to be replaced. Patient had labs done today as well.     Patient verbalized understanding of instructions.   Gordy Councilman, CMA

## 2021-06-26 ENCOUNTER — Encounter: Payer: Self-pay | Admitting: Internal Medicine

## 2021-06-27 ENCOUNTER — Other Ambulatory Visit: Payer: Self-pay | Admitting: Internal Medicine

## 2021-06-27 MED ORDER — HYDROCHLOROTHIAZIDE 12.5 MG PO CAPS: 12.5000 mg | ORAL_CAPSULE | Freq: Every day | ORAL | 1 refills | Status: DC

## 2021-07-04 ENCOUNTER — Ambulatory Visit: Payer: BC Managed Care – PPO

## 2021-07-11 ENCOUNTER — Other Ambulatory Visit: Payer: Self-pay | Admitting: Internal Medicine

## 2021-07-12 ENCOUNTER — Encounter (INDEPENDENT_AMBULATORY_CARE_PROVIDER_SITE_OTHER): Payer: BC Managed Care – PPO

## 2021-08-11 ENCOUNTER — Ambulatory Visit
Admission: RE | Admit: 2021-08-11 | Discharge: 2021-08-11 | Disposition: A | Discharge status: Home / Self Care | Payer: BC Managed Care – PPO | Source: Ambulatory Visit | Attending: Internal Medicine | Admitting: Internal Medicine

## 2021-08-11 DIAGNOSIS — I10 Essential (primary) hypertension: Secondary | ICD-10-CM | POA: Diagnosis present

## 2021-08-11 DIAGNOSIS — Z8249 Family history of ischemic heart disease and other diseases of the circulatory system: Secondary | ICD-10-CM | POA: Diagnosis present

## 2021-08-24 ENCOUNTER — Encounter (INDEPENDENT_AMBULATORY_CARE_PROVIDER_SITE_OTHER): Payer: BC Managed Care – PPO

## 2021-09-19 ENCOUNTER — Encounter: Payer: Self-pay | Admitting: Internal Medicine

## 2021-09-19 ENCOUNTER — Ambulatory Visit (INDEPENDENT_AMBULATORY_CARE_PROVIDER_SITE_OTHER): Payer: BC Managed Care – PPO | Admitting: Internal Medicine

## 2021-09-19 VITALS — BP 132/84 | HR 55 | Temp 97.8°F | Ht 74.25 in | Wt 183.8 lb

## 2021-09-19 DIAGNOSIS — Z125 Encounter for screening for malignant neoplasm of prostate: Secondary | ICD-10-CM

## 2021-09-19 DIAGNOSIS — E785 Hyperlipidemia, unspecified: Secondary | ICD-10-CM | POA: Diagnosis not present

## 2021-09-19 DIAGNOSIS — I10 Essential (primary) hypertension: Secondary | ICD-10-CM | POA: Diagnosis not present

## 2021-09-19 DIAGNOSIS — L659 Nonscarring hair loss, unspecified: Secondary | ICD-10-CM

## 2021-09-19 DIAGNOSIS — F409 Phobic anxiety disorder, unspecified: Secondary | ICD-10-CM

## 2021-09-19 DIAGNOSIS — F5105 Insomnia due to other mental disorder: Secondary | ICD-10-CM

## 2021-09-19 DIAGNOSIS — Z0000 Encounter for general adult medical examination without abnormal findings: Secondary | ICD-10-CM

## 2021-09-19 DIAGNOSIS — R5383 Other fatigue: Secondary | ICD-10-CM

## 2021-09-19 DIAGNOSIS — Z8249 Family history of ischemic heart disease and other diseases of the circulatory system: Secondary | ICD-10-CM

## 2021-09-19 DIAGNOSIS — Z1329 Encounter for screening for other suspected endocrine disorder: Secondary | ICD-10-CM

## 2021-09-19 MED ORDER — HYDROCHLOROTHIAZIDE 12.5 MG PO CAPS: 12.5000 mg | ORAL_CAPSULE | Freq: Every day | ORAL | 1 refills | Status: DC

## 2021-09-19 MED ORDER — MINOXIDIL 2.5 MG PO TABS: 1.2500 mg | ORAL_TABLET | Freq: Every day | ORAL | 3 refills | Status: DC

## 2021-09-19 NOTE — Assessment & Plan Note (Addendum)

## 2021-09-19 NOTE — Assessment & Plan Note (Signed)
His AA Ultrasound was normal  ?

## 2021-09-19 NOTE — Assessment & Plan Note (Signed)
He has been prescribed minoxidil by Dr Evorn Gong.  Advised to stop hctz if the minoxidil lowers his BP ?

## 2021-09-19 NOTE — Assessment & Plan Note (Signed)
Using alprazolam prn but not daily. The risks and benefits of benzodiazepine use were discussed with patient today including excessive sedation leading to respiratory depression,  impaired thinking/driving, and addiction.  Patient was advised to avoid concurrent use with alcohol, to use medication only as needed and not to share with others  .  ?

## 2021-09-19 NOTE — Progress Notes (Signed)
The patient is here for annual preventive examination and management of other chronic and acute problems. ?  ?The risk factors are reflected in the social history. ?  ?The roster of all physicians providing medical care to patient - is listed in the Snapshot section of the chart. ?  ?Activities of daily living:  The patient is 100% independent in all ADLs: dressing, toileting, feeding as well as independent mobility ?  ?Home safety : The patient has smoke detectors in the home. They wear seatbelts.  There are no unsecured firearms at home. There is no violence in the home.  ?  ?There is no risks for hepatitis, STDs or HIV. There is no   history of blood transfusion. They have no travel history to infectious disease endemic areas of the world. ?  ?The patient has seen their dentist in the last six month. They have seen their eye doctor in the last year. The patinet  denies slight hearing difficulty with regard to whispered voices and some television programs.  They have deferred audiologic testing in the last year.  They do not  have excessive sun exposure. Discussed the need for sun protection: hats, long sleeves and use of sunscreen if there is significant sun exposure.  ?  ?Diet: the importance of a healthy diet is discussed. They do have a healthy diet. ?  ?The benefits of regular aerobic exercise were discussed. The patient  exercises  3 days per week  for  60 minutes  most weeks but only goes when his other feels up to it.  .  ?  ?Depression screen: there are no signs or vegative symptoms of depression- irritability, change in appetite, anhedonia, sadness/tearfullness. ?  ?The following portions of the patient's history were reviewed and updated as appropriate: allergies, current medications, past family history, past medical history,  past surgical history, past social history  and problem list. ?  ?Visual acuity was not assessed per patient preference since the patient has regular follow up with an   ophthalmologist. Hearing and body mass index were assessed and reviewed.  ?  ?During the course of the visit the patient was educated and counseled about appropriate screening and preventive services including : fall prevention , diabetes screening, nutrition counseling, colorectal cancer screening, and recommended immunizations.   ? ?Chief Complaint: ? ? ?1)  Elevated BP readings at home .  Taking hctz 12.5 mg daily  home pressure machine reading in office   142/98  . Home readings have been < 138/88 on a regular basis  ? ?2) dermatology  has prescribed minoxidil for hair loss/general thinning .  Has not started it.  ? ?Review of Symptoms ? ?Patient denies headache, fevers, malaise, unintentional weight loss, skin rash, eye pain, sinus congestion and sinus pain, sore throat, dysphagia,  hemoptysis , cough, dyspnea, wheezing, chest pain, palpitations, orthopnea, edema, abdominal pain, nausea, melena, diarrhea, constipation, flank pain, dysuria, hematuria, urinary  Frequency, nocturia, numbness, tingling, seizures,  Focal weakness, Loss of consciousness,  Tremor, , depression,  and suicidal ideation.   ? ?Physical Exam: ? ?BP 132/84 (BP Location: Left Arm, Patient Position: Sitting, Cuff Size: Normal)   Pulse (!) 55   Temp 97.8 ?F (36.6 ?C) (Oral)   Ht 6' 2.25" (1.886 m)   Wt 183 lb 12.8 oz (83.4 kg)   SpO2 97%   BMI 23.44 kg/m?   ?General appearance: alert, cooperative and appears stated age ?Ears: normal TM's and external ear canals both ears ?Throat: lips, mucosa,  and tongue normal; teeth and gums normal ?Neck: no adenopathy, no carotid bruit, supple, symmetrical, trachea midline and thyroid not enlarged, symmetric, no tenderness/mass/nodules ?Back: symmetric, no curvature. ROM normal. No CVA tenderness. ?Lungs: clear to auscultation bilaterally ?Heart: regular rate and rhythm, S1, S2 normal, no murmur, click, rub or gallop ?Abdomen: soft, non-tender; bowel sounds normal; no masses,  no organomegaly ?Pulses:  2+ and symmetric ?Skin: Skin color, texture, turgor normal. No rashes or lesions ?Lymph nodes: Cervical, supraclavicular, and axillary nodes normal.  ? ? ?Assessment and Plan: ? ?Hypertension ?Home readings (adjusted for today's calibration) have been 120/70  To 130/80 .  Continue hctz at 12.5 mg and suspend if readings drop below 110/80 ? ?Insomnia due to anxiety and fear ?Using alprazolam prn but not daily. The risks and benefits of benzodiazepine use were discussed with patient today including excessive sedation leading to respiratory depression,  impaired thinking/driving, and addiction.  Patient was advised to avoid concurrent use with alcohol, to use medication only as needed and not to share with others  .  ? ?Alopecia ?He has been prescribed minoxidil by Dr Evorn Gong.  Advised to stop hctz if the minoxidil lowers his BP ? ?Family history of abdominal aortic aneurysm (AAA) ?His AA Ultrasound was normal  ? ?Encounter for preventive health examination ?age appropriate education and counseling updated, referrals for preventative services and immunizations addressed, dietary and smoking counseling addressed, most recent labs reviewed.  I have personally reviewed and have noted: ?  ?1) the patient's medical and social history ?2) The pt's use of alcohol, tobacco, and illicit drugs ?3) The patient's current medications and supplements ?4) Functional ability including ADL's, fall risk, home safety risk, hearing and visual impairment ?5) Diet and physical activities ?6) Evidence for depression or mood disorder ?7) The patient's height, weight, and BMI have been recorded in the chart  ?I have made referrals, and provided counseling and education based on review of the above ? ? ?Updated Medication List ?Outpatient Encounter Medications as of 09/19/2021  ?Medication Sig  ? ALPRAZolam (XANAX) 0.25 MG tablet Take 0.5 tablets (0.125 mg total) by mouth daily as needed for anxiety or sleep.  ? finasteride (PROPECIA) 1 MG tablet  Take 1 mg by mouth daily.  ? Red Yeast Rice 600 MG CAPS Take 1,200 mg by mouth daily.  ? triamcinolone (KENALOG) 0.1 % Apply topically 2 (two) times daily.  ? [DISCONTINUED] hydrochlorothiazide (MICROZIDE) 12.5 MG capsule TAKE 1 CAPSULE BY MOUTH DAILY  ? hydrochlorothiazide (MICROZIDE) 12.5 MG capsule Take 1 capsule (12.5 mg total) by mouth daily.  ? minoxidil (LONITEN) 2.5 MG tablet Take 0.5 tablets (1.25 mg total) by mouth daily.  ? [DISCONTINUED] minoxidil (LONITEN) 2.5 MG tablet Take 1.25 mg by mouth daily. (Patient not taking: Reported on 09/19/2021)  ? ?No facility-administered encounter medications on file as of 09/19/2021.  ?  ?

## 2021-09-19 NOTE — Assessment & Plan Note (Signed)
Home readings (adjusted for today's calibration) have been 120/70  To 130/80 .  Continue hctz at 12.5 mg and suspend if readings drop below 110/80 ?

## 2021-09-19 NOTE — Patient Instructions (Addendum)
Your home [employer] machine is measuring 10 pts higher than our office cuffs.  ? ?Use the arm cuff and subtract 10 pts  from each reading (both systolic and diastolic)  to estimate blood pressure more accurately  ? ?Continue 12.5  mg hctz daily for now.    Stop if your home readings start to stay below 110/70 ? ? ?Return for labs on or after April 9   ? ?   ?

## 2021-09-26 ENCOUNTER — Other Ambulatory Visit (INDEPENDENT_AMBULATORY_CARE_PROVIDER_SITE_OTHER): Payer: BC Managed Care – PPO

## 2021-09-26 DIAGNOSIS — Z125 Encounter for screening for malignant neoplasm of prostate: Secondary | ICD-10-CM

## 2021-09-26 DIAGNOSIS — Z1329 Encounter for screening for other suspected endocrine disorder: Secondary | ICD-10-CM | POA: Diagnosis not present

## 2021-09-26 DIAGNOSIS — R5383 Other fatigue: Secondary | ICD-10-CM

## 2021-09-26 DIAGNOSIS — E785 Hyperlipidemia, unspecified: Secondary | ICD-10-CM

## 2021-09-26 DIAGNOSIS — I10 Essential (primary) hypertension: Secondary | ICD-10-CM | POA: Diagnosis not present

## 2021-09-26 LAB — COMPREHENSIVE METABOLIC PANEL
ALT: 16 U/L (ref 0–53)
AST: 19 U/L (ref 0–37)
Albumin: 4.2 g/dL (ref 3.5–5.2)
Alkaline Phosphatase: 62 U/L (ref 39–117)
BUN: 15 mg/dL (ref 6–23)
CO2: 30 mEq/L (ref 19–32)
Calcium: 9.3 mg/dL (ref 8.4–10.5)
Chloride: 101 mEq/L (ref 96–112)
Creatinine, Ser: 0.88 mg/dL (ref 0.40–1.50)
GFR: 90.67 mL/min (ref >60.00)
Glucose, Bld: 80 mg/dL (ref 70–99)
Potassium: 3.7 mEq/L (ref 3.5–5.1)
Sodium: 139 mEq/L (ref 135–145)
Total Bilirubin: 0.7 mg/dL (ref 0.2–1.2)
Total Protein: 6.4 g/dL (ref 6.0–8.3)

## 2021-09-26 LAB — LIPID PANEL
Cholesterol: 189 mg/dL (ref 0–200)
HDL: 43.8 mg/dL (ref >39.00)
LDL Cholesterol: 114 mg/dL — ABNORMAL HIGH (ref 0–99)
NonHDL: 145.68
Total CHOL/HDL Ratio: 4
Triglycerides: 159 mg/dL — ABNORMAL HIGH (ref 0.0–149.0)
VLDL: 31.8 mg/dL (ref 0.0–40.0)

## 2021-09-26 LAB — CBC WITH DIFFERENTIAL/PLATELET
Basophils Absolute: 0.1 10*3/uL (ref 0.0–0.1)
Basophils Relative: 1.8 % (ref 0.0–3.0)
Eosinophils Absolute: 0.1 10*3/uL (ref 0.0–0.7)
Eosinophils Relative: 1.9 % (ref 0.0–5.0)
HCT: 47.4 % (ref 39.0–52.0)
Hemoglobin: 16.5 g/dL (ref 13.0–17.0)
Lymphocytes Relative: 35.2 % (ref 12.0–46.0)
Lymphs Abs: 1.7 10*3/uL (ref 0.7–4.0)
MCHC: 34.7 g/dL (ref 30.0–36.0)
MCV: 94.1 fl (ref 78.0–100.0)
Monocytes Absolute: 0.5 10*3/uL (ref 0.1–1.0)
Monocytes Relative: 9.7 % (ref 3.0–12.0)
Neutro Abs: 2.5 10*3/uL (ref 1.4–7.7)
Neutrophils Relative %: 51.4 % (ref 43.0–77.0)
Platelets: 204 10*3/uL (ref 150.0–400.0)
RBC: 5.04 Mil/uL (ref 4.22–5.81)
RDW: 13.1 % (ref 11.5–15.5)
WBC: 4.9 10*3/uL (ref 4.0–10.5)

## 2021-09-26 LAB — PSA: PSA: 0.07 ng/mL — ABNORMAL LOW (ref 0.10–4.00)

## 2021-09-26 LAB — TSH: TSH: 2.19 u[IU]/mL (ref 0.35–5.50)

## 2021-09-27 NOTE — Assessment & Plan Note (Signed)
10 YR RISK IS NOW 15%   ?

## 2021-10-03 ENCOUNTER — Telehealth (INDEPENDENT_AMBULATORY_CARE_PROVIDER_SITE_OTHER): Payer: BC Managed Care – PPO | Admitting: Internal Medicine

## 2021-10-03 ENCOUNTER — Encounter: Payer: Self-pay | Admitting: Internal Medicine

## 2021-10-03 DIAGNOSIS — E785 Hyperlipidemia, unspecified: Secondary | ICD-10-CM | POA: Diagnosis not present

## 2021-10-03 DIAGNOSIS — Z8249 Family history of ischemic heart disease and other diseases of the circulatory system: Secondary | ICD-10-CM

## 2021-10-04 NOTE — Progress Notes (Signed)
Virtual Visit via [place] Note ? ?This visit type was conducted due to national recommendations for restrictions regarding the COVID-19 pandemic (e.g. social distancing).  This format is felt to be most appropriate for this patient at this time.  All issues noted in this document were discussed and addressed.  No physical exam was performed (except for noted visual exam findings with Video Visits).  ? ?I connected withNAME@ on 10/04/21 at  4:30 PM EDT by a video enabled telemedicine application  and verified that I am speaking with the correct person using two identifiers. ?Location patient: home ?Location provider: work or home office ?Persons participating in the virtual visit: patient, provider ? ?I discussed the limitations, risks, security and privacy concerns of performing an evaluation and management service by telephone and the availability of in person appointments. I also discussed with the patient that there may be a patient responsible charge related to this service. The patient expressed understanding and agreed to proceed. ? ?Reason for visit: follow up on recent labs  ? ?HPI: ? ?66 yr old male with hypertension managed with hctz presents for management of mild hyperlipidemia.   ?Labs reviewed with patient today.  He has a calculated risk of 15% .  He takes Red yeast rice 1200 mg daily.  No known aortic atherosclerosis or CAD.  Hctz well managed wit hctz alone  ?. ? ?Diet reviewed:  eats fish 2-3 times per week . No alcohol.  Red meat rarely. No junk food.  ? ?ROS: See pertinent positives and negatives per HPI. ? ?Past Medical History:  ?Diagnosis Date  ? Allergy   ? seasonal  ? History of colon polyps   ? ? ?No past surgical history on file. ? ?Family History  ?Problem Relation Age of Onset  ? Hearing loss Mother   ? Hypertension Mother   ? Hearing loss Father   ? Hyperlipidemia Father   ? Heart disease Father 28  ?     CAD, 7 vessel bypass at age 57   ? Cancer Father   ? Heart failure Paternal  Grandmother   ? Heart attack Paternal Grandfather   ? ? ?SOCIAL HX:  reports that he has quit smoking. His smoking use included cigarettes. He has a 3.00 pack-year smoking history. He has never used smokeless tobacco. He reports that he does not drink alcohol and does not use drugs.  ? ? ?Current Outpatient Medications:  ?  ALPRAZolam (XANAX) 0.25 MG tablet, Take 0.5 tablets (0.125 mg total) by mouth daily as needed for anxiety or sleep., Disp: 20 tablet, Rfl: 0 ?  finasteride (PROPECIA) 1 MG tablet, Take 1 mg by mouth daily., Disp: , Rfl:  ?  hydrochlorothiazide (MICROZIDE) 12.5 MG capsule, Take 1 capsule (12.5 mg total) by mouth daily., Disp: 30 capsule, Rfl: 1 ?  minoxidil (LONITEN) 2.5 MG tablet, Take 0.5 tablets (1.25 mg total) by mouth daily., Disp: 30 tablet, Rfl: 3 ?  Red Yeast Rice 600 MG CAPS, Take 1,200 mg by mouth daily., Disp: , Rfl:  ?  triamcinolone (KENALOG) 0.1 %, Apply topically 2 (two) times daily., Disp: , Rfl:  ? ?EXAM: ? ?VITALS per patient if applicable: ? ?GENERAL: alert, oriented, appears well and in no acute distress ? ?HEENT: atraumatic, conjunttiva clear, no obvious abnormalities on inspection of external nose and ears ? ?NECK: normal movements of the head and neck ? ?LUNGS: on inspection no signs of respiratory distress, breathing rate appears normal, no obvious gross SOB, gasping or wheezing ? ?  CV: no obvious cyanosis ? ?MS: moves all visible extremities without noticeable abnormality ? ?PSYCH/NEURO: pleasant and cooperative, no obvious depression or anxiety, speech and thought processing grossly intact ? ?ASSESSMENT AND PLAN: ? ?Discussed the following assessment and plan: ? ?Hyperlipidemia LDL goal <130 - Plan: Ambulatory referral to Cardiology ? ?Family history of abdominal aortic aneurysm (AAA) ? ?Hyperlipidemia LDL goal <130 ?Risks and benefits of statin therapy discussed .  He has no known evidence of CAD or aortic atherosclerosis.  He would like to assess his risk for CAD with a  coronary calcium CT score before deciding on use of statins.  Referral to cardiology in process  ? ?  ?I discussed the assessment and treatment plan with the patient. The patient was provided an opportunity to ask questions and all were answered. The patient agreed with the plan and demonstrated an understanding of the instructions. ?  ?The patient was advised to call back or seek an in-person evaluation if the symptoms worsen or if the condition fails to improve as anticipated. ? ? ? ?Crecencio Mc, MD   ?

## 2021-10-04 NOTE — Assessment & Plan Note (Signed)
Risks and benefits of statin therapy discussed .  He has no known evidence of CAD or aortic atherosclerosis.  He would like to assess his risk for CAD with a coronary calcium CT score before deciding on use of statins.  Referral to cardiology in process  ?

## 2021-11-29 ENCOUNTER — Ambulatory Visit: Payer: BC Managed Care – PPO | Admitting: Cardiology

## 2022-01-16 ENCOUNTER — Ambulatory Visit: Payer: Medicare PPO | Admitting: Cardiology

## 2022-01-16 ENCOUNTER — Ambulatory Visit
Admission: RE | Admit: 2022-01-16 | Discharge: 2022-01-16 | Disposition: A | Discharge status: Home / Self Care | Payer: Medicare PPO | Source: Ambulatory Visit | Attending: Cardiology | Admitting: Cardiology

## 2022-01-16 ENCOUNTER — Encounter: Payer: Self-pay | Admitting: Cardiology

## 2022-01-16 VITALS — BP 140/80 | HR 61 | Ht 74.5 in | Wt 183.2 lb

## 2022-01-16 DIAGNOSIS — I10 Essential (primary) hypertension: Secondary | ICD-10-CM

## 2022-01-16 DIAGNOSIS — E782 Mixed hyperlipidemia: Secondary | ICD-10-CM | POA: Insufficient documentation

## 2022-01-16 DIAGNOSIS — R609 Edema, unspecified: Secondary | ICD-10-CM

## 2022-01-16 NOTE — Patient Instructions (Signed)
Medication Instructions:  - Your physician recommends that you continue on your current medications as directed. Please refer to the Current Medication list given to you today.  *If you need a refill on your cardiac medications before your next appointment, please call your pharmacy*   Lab Work: - none ordered  If you have labs (blood work) drawn today and your tests are completely normal, you will receive your results only by: [place] (if you have MyChart) OR A paper copy in the mail If you have any lab test that is abnormal or we need to change your treatment, we will call you to review the results.   Testing/Procedures:  CT Coronary Calcium Score:  Your physician has recommended that you have CT Coronary Calcium Score.  - $99 out of pocket cost at the time of your test - Call [phone] to schedule at your convenience.  Location: [address] [place], [place] [address]     Follow-Up: At [facility], you and your health needs are our priority.  As part of our continuing mission to provide you with exceptional heart care, we have created designated Provider Care Teams.  These Care Teams include your primary Cardiologist (physician) and Advanced Practice Providers (APPs -  Physician Assistants and Nurse Practitioners) who all work together to provide you with the care you need, when you need it.  We recommend signing up for the patient portal called "MyChart".  Sign up information is provided on this After Visit Summary.  MyChart is used to connect with patients for Virtual Visits (Telemedicine).  Patients are able to view lab/test results, encounter notes, upcoming appointments, etc.  Non-urgent messages can be sent to your provider as well.   To learn more about what you can do with MyChart, go to [email or web address].    Your next appointment:   4-6 week(s)  The format for your next appointment:   In  Person  Provider:   You may see Kate Sable, MD or one of the following Advanced Practice Providers on your designated Care Team:   Murray Hodgkins, NP Christell Faith, PA-C Cadence Kathlen Mody, [place]    Other Instructions N/a  Important Information About Sugar

## 2022-01-16 NOTE — Progress Notes (Signed)
Cardiology Office Note:    Date:  01/16/2022   ID:  Bobby Mills, DOB 25-Nov-1955, MRN [id number]  PCP:  Bobby Mc, MD   [facility] Cardiologist:  Bobby Sable, MD     Referring MD: Bobby Mc, MD   Chief Complaint  Patient presents with   Hyperlipdemia    Patient states that he is doing fine. Left foot swells in the summer. Patient has questions about blood pressure medications.Meds reviewed with patient.     History of Present Illness:    Bobby Mills is a 66 y.o. male with a hx of hypertension, hyperlipidemia, former smoker who presents due to hyperlipidemia and cardiac risk stratification.  Cholesterol levels obtained 3 months ago were elevated.  Statin therapy discussed with primary care physician, patient wanted further risk stratification with calcium score prior to considering statin.  Denies chest pain or shortness of breath.  Endorsed eating low-cholesterol diet.  Diagnosed with hypertension 6 months ago, started on HCTZ which patient took for couple of months.  Has not taken HCTZ over the past 2 weeks now.  States blood pressures have been controlled at home with systolics around 409.  States his legs get swollen after being on his feet for long.  Past Medical History:  Diagnosis Date   Allergy    seasonal   History of colon polyps     History reviewed. No pertinent surgical history.  Current Medications: Current Meds  Medication Sig   ALPRAZolam (XANAX) 0.25 MG tablet Take 0.5 tablets (0.125 mg total) by mouth daily as needed for anxiety or sleep.   finasteride (PROPECIA) 1 MG tablet Take 1 mg by mouth daily.   minoxidil (LONITEN) 2.5 MG tablet Take 0.5 tablets (1.25 mg total) by mouth daily.   Red Yeast Rice 600 MG CAPS Take 1,200 mg by mouth daily.   triamcinolone (KENALOG) 0.1 % Apply topically 2 (two) times daily.     Allergies:   Patient has no known allergies.   Social History   Socioeconomic History   Marital  status: Single    Spouse name: Not on file   Number of children: Not on file   Years of education: Not on file   Highest education level: Not on file  Occupational History   Occupation: Dance movement psychotherapist  Tobacco Use   Smoking status: Former    Packs/day: 0.10    Years: 30.00    Total pack years: 3.00    Types: Cigarettes   Smokeless tobacco: Never  Substance and Sexual Activity   Alcohol use: No   Drug use: No   Sexual activity: Never  Other Topics Concern   Not on file  Social History Narrative   Patient has retired from his [employer] job and  relocated to [place] from [place],  To assist his parents in their old age.  He has since moved in with his mother since his father passed.    Social Determinants of Health   Financial Resource Strain: Not on file  Food Insecurity: Not on file  Transportation Needs: Not on file  Physical Activity: Inactive (06/24/2017)   Exercise Vital Sign    Days of Exercise per Week: 0 days    Minutes of Exercise per Session: 0 min  Stress: No Stress Concern Present (06/24/2017)   [place]    Feeling of Stress : Only a little  Social Connections: Moderately Integrated (06/24/2017)   Social Connection and Isolation Panel [  NHANES]    Frequency of Communication with Friends and Family: More than three times a week    Frequency of Social Gatherings with Friends and Family: More than three times a week    Attends Religious Services: More than 4 times per year    Active Member of [employer] or Organizations: Yes    Attends Archivist Meetings: 1 to 4 times per year    Marital Status: Never married     Family History: The patient's family history includes Cancer in his father; Hearing loss in his father and mother; Heart attack in his paternal grandfather; Heart disease (age of onset: 69) in his father; Heart failure in his paternal grandmother; Hyperlipidemia in his father;  Hypertension in his mother.  ROS:   Please see the history of present illness.     All other systems reviewed and are negative.  EKGs/Labs/Other Studies Reviewed:    The following studies were reviewed today:   EKG:  EKG is  ordered today.  The ekg ordered today demonstrates normal sinus rhythm  Recent Labs: 09/26/2021: ALT 16; BUN 15; Creatinine, Ser 0.88; Hemoglobin 16.5; Platelets 204.0; Potassium 3.7; Sodium 139; TSH 2.19  Recent Lipid Panel    Component Value Date/Time   CHOL 189 09/26/2021 0756   TRIG 159.0 (H) 09/26/2021 0756   HDL 43.80 09/26/2021 0756   CHOLHDL 4 09/26/2021 0756   VLDL 31.8 09/26/2021 0756   LDLCALC 114 (H) 09/26/2021 0756     Risk Assessment/Calculations:          Physical Exam:    VS:  BP 140/80 (BP Location: Left Arm, Patient Position: Sitting, Cuff Size: Normal)   Pulse 61   Ht 6' 2.5" (1.892 m)   Wt 183 lb 3.2 oz (83.1 kg)   SpO2 97%   BMI 23.21 kg/m     Wt Readings from Last 3 Encounters:  01/16/22 183 lb 3.2 oz (83.1 kg)  10/03/21 180 lb (81.6 kg)  09/19/21 183 lb 12.8 oz (83.4 kg)     GEN:  Well nourished, well developed in no acute distress HEENT: Normal NECK: No JVD; No carotid bruits CARDIAC: RRR, no murmurs, rubs, gallops RESPIRATORY:  Clear to auscultation without rales, wheezing or rhonchi  ABDOMEN: Soft, non-tender, non-distended MUSCULOSKELETAL:  No edema; No deformity  SKIN: Warm and dry NEUROLOGIC:  Alert and oriented x 3 PSYCHIATRIC:  Normal affect   ASSESSMENT:    1. Mixed hyperlipidemia   2. Primary hypertension   3. Dependent edema    PLAN:    In order of problems listed above:  Mixed hyperlipidemia, 10-year ASCVD risk 16.2%.  Moderate to high intensity statin recommended regardless of coronary calcium findings, obtain coronary calcium score for additional cardiac risk stratification. Hypertension, BP elevated today, usually controlled at home.  Monitor BPs daily at home, if elevated, recommend  restarting HCTZ 12.5 mg daily. Dependent edema, compression stockings, leg raises advised.  Follow-up in 4 to 6 weeks.      Medication Adjustments/Labs and Tests Ordered: Current medicines are reviewed at length with the patient today.  Concerns regarding medicines are outlined above.  Orders Placed This Encounter  Procedures   CT CARDIAC SCORING (SELF PAY ONLY)   EKG 12-Lead   No orders of the defined types were placed in this encounter.   Patient Instructions  Medication Instructions:  - Your physician recommends that you continue on your current medications as directed. Please refer to the Current Medication list given to you today.  *  If you need a refill on your cardiac medications before your next appointment, please call your pharmacy*   Lab Work: - none ordered  If you have labs (blood work) drawn today and your tests are completely normal, you will receive your results only by: [place] (if you have MyChart) OR A paper copy in the mail If you have any lab test that is abnormal or we need to change your treatment, we will call you to review the results.   Testing/Procedures:  CT Coronary Calcium Score:  Your physician has recommended that you have CT Coronary Calcium Score.  - $99 out of pocket cost at the time of your test - Call [phone] to schedule at your convenience.  Location: [place], [place] [address]     Follow-Up: At [facility], you and your health needs are our priority.  As part of our continuing mission to provide you with exceptional heart care, we have created designated Provider Care Teams.  These Care Teams include your primary Cardiologist (physician) and Advanced Practice Providers (APPs -  Physician Assistants and Nurse Practitioners) who all work together to provide you with the care you need, when you need it.  We recommend signing up for the patient portal  called "MyChart".  Sign up information is provided on this After Visit Summary.  MyChart is used to connect with patients for Virtual Visits (Telemedicine).  Patients are able to view lab/test results, encounter notes, upcoming appointments, etc.  Non-urgent messages can be sent to your provider as well.   To learn more about what you can do with MyChart, go to [email or web address].    Your next appointment:   4-6 week(s)  The format for your next appointment:   In Person  Provider:   You may see Bobby Sable, MD or one of the following Advanced Practice Providers on your designated Care Team:   Murray Hodgkins, NP Christell Faith, PA-C Cadence Kathlen Mody, [place]    Other Instructions N/a  Important Information About Sugar         Signed, Bobby Sable, MD  01/16/2022 3:33 PM    [place]

## 2022-01-17 DIAGNOSIS — L538 Other specified erythematous conditions: Secondary | ICD-10-CM | POA: Diagnosis not present

## 2022-01-17 DIAGNOSIS — L570 Actinic keratosis: Secondary | ICD-10-CM | POA: Diagnosis not present

## 2022-01-17 DIAGNOSIS — L448 Other specified papulosquamous disorders: Secondary | ICD-10-CM | POA: Diagnosis not present

## 2022-01-17 DIAGNOSIS — L820 Inflamed seborrheic keratosis: Secondary | ICD-10-CM | POA: Diagnosis not present

## 2022-01-26 ENCOUNTER — Telehealth: Payer: Self-pay | Admitting: Emergency Medicine

## 2022-01-26 NOTE — Telephone Encounter (Signed)
Called patient to go over results. No answer. Lmtcb.

## 2022-01-26 NOTE — Telephone Encounter (Signed)
-----   Message from Kate Sable, MD sent at 01/20/2022  5:54 PM EDT ----- Mild LAD calcifications, low coronary calcium score, triglycerides and LDL elevated, recommend statin therapy/Lipitor 40 mg daily if patient is agreeable.

## 2022-01-31 MED ORDER — ATORVASTATIN CALCIUM 40 MG PO TABS: 40.0000 mg | ORAL_TABLET | Freq: Every day | ORAL | 3 refills | Status: DC

## 2022-01-31 NOTE — Addendum Note (Signed)
Addended by: Kavin Leech on: 01/31/2022 01:21 PM   Modules accepted: Orders

## 2022-01-31 NOTE — Telephone Encounter (Signed)
The patient has been notified of the result and verbalized understanding.  All questions (if any) were answered. Kavin Leech, RN 01/31/2022 1:21 PM

## 2022-02-02 ENCOUNTER — Encounter: Payer: Self-pay | Admitting: Internal Medicine

## 2022-02-02 DIAGNOSIS — N138 Other obstructive and reflux uropathy: Secondary | ICD-10-CM

## 2022-02-16 ENCOUNTER — Encounter: Payer: Self-pay | Admitting: Urology

## 2022-02-16 ENCOUNTER — Ambulatory Visit: Payer: Medicare PPO | Admitting: Urology

## 2022-02-16 VITALS — BP 148/86 | HR 64 | Ht 74.0 in | Wt 180.0 lb

## 2022-02-16 DIAGNOSIS — Z125 Encounter for screening for malignant neoplasm of prostate: Secondary | ICD-10-CM | POA: Diagnosis not present

## 2022-02-16 DIAGNOSIS — N138 Other obstructive and reflux uropathy: Secondary | ICD-10-CM

## 2022-02-16 DIAGNOSIS — N401 Enlarged prostate with lower urinary tract symptoms: Secondary | ICD-10-CM | POA: Diagnosis not present

## 2022-02-16 DIAGNOSIS — R351 Nocturia: Secondary | ICD-10-CM | POA: Diagnosis not present

## 2022-02-16 LAB — BLADDER SCAN AMB NON-IMAGING

## 2022-02-16 MED ORDER — TAMSULOSIN HCL 0.4 MG PO CAPS: 0.4000 mg | ORAL_CAPSULE | Freq: Every day | ORAL | 11 refills | Status: DC

## 2022-02-16 NOTE — Patient Instructions (Signed)

## 2022-02-16 NOTE — Progress Notes (Signed)
   02/16/22 2:38 PM   Bobby Mills [birth year] [id number]  CC: Nocturia, PSA screening, ED  HPI: I saw Mr. Buelow for the above issues.  He is a 66 year old male who reports at least a year of nocturia that can range from 0-4 times overnight.  This is increasingly interfering with his sleep and is becoming more bothersome.  He has some frequency during the day but that is not as bothersome.  He drinks primarily water, and minimizes fluids in the evening.  He has tried any medications for this.  PSA has been well within the normal range, most recently 0.07 in April 2023, corrected for finasteride(indication hair loss) 0.14, and has been stable over the last 5 years.  He denies any dysuria or gross hematuria.  He also reports some mild ED, but is not bothered enough to consider medications at this time.  Urinalysis today is completely benign, and PVR is normal at 0 mL.   PMH: Past Medical History:  Diagnosis Date   Allergy    seasonal   History of colon polyps     Family History: Family History  Problem Relation Age of Onset   Hearing loss Mother    Hypertension Mother    Hearing loss Father    Hyperlipidemia Father    Heart disease Father 40       CAD, 7 vessel bypass at age 69    Cancer Father    Heart failure Paternal Grandmother    Heart attack Paternal Grandfather     Social History:  reports that he has quit smoking. His smoking use included cigarettes. He has a 3.00 pack-year smoking history. He has been exposed to tobacco smoke. He has never used smokeless tobacco. He reports that he does not drink alcohol and does not use drugs.  Physical Exam: BP (!) 148/86   Pulse 64   Ht '6\' 2"'$  (1.88 m)   Wt 180 lb (81.6 kg)   BMI 23.11 kg/m    Constitutional:  Alert and oriented, No acute distress. Cardiovascular: No clubbing, cyanosis, or edema. Respiratory: Normal respiratory effort, no increased work of breathing. GI: Abdomen is soft, nontender, nondistended, no  abdominal masses GU: phallus without lesions, widely patent meatus DRE: 20 g, smooth, no nodules or masses  Laboratory Data: Reviewed, see HPI  Assessment & Plan:   66 year old male with primarily bothersome nocturia 0-4 times overnight.  PSA has been well within the normal range.  He also has mild ED but not bothered enough to consider medications.  Urinalysis and PVR normal, exam benign.  We reviewed behavioral strategies including minimizing fluids before bed, double voiding, avoiding bladder irritants.  We reviewed the relationship between sleep apnea and nocturia, but he does not have nocturia every night, and his body habitus would not be classic for sleep apnea presentation.  We discussed options including behavioral strategies alone or trial of medication and he was interested in a trial of Flomax.  Risk and benefits discussed.  Trial of Flomax 0.4 mg nightly, RTC 1 month PVR, consider trial of an OAB medication if no improvement on Flomax  Nickolas Madrid, MD 02/16/2022  [facility] [address], [place], [place] [address] [phone]

## 2022-02-17 ENCOUNTER — Encounter: Payer: Self-pay | Admitting: Cardiology

## 2022-02-17 ENCOUNTER — Ambulatory Visit: Payer: Medicare PPO | Attending: Cardiology | Admitting: Cardiology

## 2022-02-17 VITALS — BP 120/80 | HR 64 | Ht 74.5 in | Wt 180.1 lb

## 2022-02-17 DIAGNOSIS — E782 Mixed hyperlipidemia: Secondary | ICD-10-CM | POA: Diagnosis not present

## 2022-02-17 DIAGNOSIS — R030 Elevated blood-pressure reading, without diagnosis of hypertension: Secondary | ICD-10-CM

## 2022-02-17 DIAGNOSIS — I2510 Atherosclerotic heart disease of native coronary artery without angina pectoris: Secondary | ICD-10-CM

## 2022-02-17 DIAGNOSIS — I2584 Coronary atherosclerosis due to calcified coronary lesion: Secondary | ICD-10-CM | POA: Diagnosis not present

## 2022-02-17 LAB — MICROSCOPIC EXAMINATION: Bacteria, UA: NONE SEEN

## 2022-02-17 LAB — URINALYSIS, COMPLETE
Bilirubin, UA: NEGATIVE
Glucose, UA: NEGATIVE
Ketones, UA: NEGATIVE
Leukocytes,UA: NEGATIVE
Nitrite, UA: NEGATIVE
Protein,UA: NEGATIVE
RBC, UA: NEGATIVE
Specific Gravity, UA: 1.015 (ref 1.005–1.030)
Urobilinogen, Ur: 0.2 mg/dL (ref 0.2–1.0)
pH, UA: 5 (ref 5.0–7.5)

## 2022-02-17 NOTE — Patient Instructions (Signed)
Medication Instructions:   Your physician recommends that you continue on your current medications as directed. Please refer to the Current Medication list given to you today.  *If you need a refill on your cardiac medications before your next appointment, please call your pharmacy*   Lab Work:  Your physician recommends that you return for a FASTING lipid profile: 3 months  - You will need to be fasting. Please do not have anything to eat or drink after midnight the morning you have the lab work. You may only have water or black coffee with no cream or sugar.   - Please go to the [facility]. You will check in at the front desk to the right as you walk into the atrium. Valet Parking is offered if needed. - No appointment needed. You may go any day between 7 am and 6 pm.     Follow-Up: At [facility], you and your health needs are our priority.  As part of our continuing mission to provide you with exceptional heart care, we have created designated Provider Care Teams.  These Care Teams include your primary Cardiologist (physician) and Advanced Practice Providers (APPs -  Physician Assistants and Nurse Practitioners) who all work together to provide you with the care you need, when you need it.  We recommend signing up for the patient portal called "MyChart".  Sign up information is provided on this After Visit Summary.  MyChart is used to connect with patients for Virtual Visits (Telemedicine).  Patients are able to view lab/test results, encounter notes, upcoming appointments, etc.  Non-urgent messages can be sent to your provider as well.   To learn more about what you can do with MyChart, go to [email or web address].    Your next appointment:   6 month(s)  The format for your next appointment:   In Person  Provider:   Kate Sable, MD    Other Instructions   Important Information About Sugar

## 2022-02-17 NOTE — Progress Notes (Signed)
Cardiology Office Note:    Date:  02/17/2022   ID:  Bobby Mills, DOB 1955-09-25, MRN [id number]  PCP:  Crecencio Mc, MD   [facility] Cardiologist:  Kate Sable, MD     Referring MD: Crecencio Mc, MD   Chief Complaint  Patient presents with   Other    4-6 wk f/u CT pt would like to discuss BP. Meds reviewed verbally with pt.    History of Present Illness:    Bobby Mills is a 66 y.o. male with a hx of hyperlipidemia, former smoker who presents for follow-up.    Previously seen due to hyperlipidemia.  Statin was recommended, patient wanted further restratification.  Calcium score was obtained, with score of 72.4, 50th percentile.  Denies chest pain or shortness of breath.  Lipitor was recommended, patient has not started yet.  Otherwise doing okay, blood pressures at home average 626R systolic, diastolic in the 48N.   Past Medical History:  Diagnosis Date   Allergy    seasonal   History of colon polyps     History reviewed. No pertinent surgical history.  Current Medications: Current Meds  Medication Sig   ALPRAZolam (XANAX) 0.25 MG tablet Take 0.5 tablets (0.125 mg total) by mouth daily as needed for anxiety or sleep.   atorvastatin (LIPITOR) 40 MG tablet Take 1 tablet (40 mg total) by mouth daily.   finasteride (PROPECIA) 1 MG tablet Take 1 mg by mouth daily.   minoxidil (LONITEN) 2.5 MG tablet Take 0.5 tablets (1.25 mg total) by mouth daily.   Red Yeast Rice 600 MG CAPS Take 1,200 mg by mouth daily.   tamsulosin (FLOMAX) 0.4 MG CAPS capsule Take 1 capsule (0.4 mg total) by mouth daily.   triamcinolone (KENALOG) 0.1 % Apply topically 2 (two) times daily.     Allergies:   Patient has no known allergies.   Social History   Socioeconomic History   Marital status: Single    Spouse name: Not on file   Number of children: Not on file   Years of education: Not on file   Highest education level: Not on file  Occupational History    Occupation: Dance movement psychotherapist  Tobacco Use   Smoking status: Former    Packs/day: 0.10    Years: 30.00    Total pack years: 3.00    Types: Cigarettes    Passive exposure: Past   Smokeless tobacco: Never  Substance and Sexual Activity   Alcohol use: No   Drug use: No   Sexual activity: Never  Other Topics Concern   Not on file  Social History Narrative   Patient has retired from his [employer] job and  relocated to [place] from [place],  To assist his parents in their old age.  He has since moved in with his mother since his father passed.    Social Determinants of Health   Financial Resource Strain: Not on file  Food Insecurity: Not on file  Transportation Needs: Not on file  Physical Activity: Inactive (06/24/2017)   Exercise Vital Sign    Days of Exercise per Week: 0 days    Minutes of Exercise per Session: 0 min  Stress: No Stress Concern Present (06/24/2017)   [place]    Feeling of Stress : Only a little  Social Connections: Moderately Integrated (06/24/2017)   Social Connection and Isolation Panel [NHANES]    Frequency of Communication with Friends and Family: More  than three times a week    Frequency of Social Gatherings with Friends and Family: More than three times a week    Attends Religious Services: More than 4 times per year    Active Member of [employer] or Organizations: Yes    Attends Archivist Meetings: 1 to 4 times per year    Marital Status: Never married     Family History: The patient's family history includes Cancer in his father; Hearing loss in his father and mother; Heart attack in his paternal grandfather; Heart disease (age of onset: 33) in his father; Heart failure in his paternal grandmother; Hyperlipidemia in his father; Hypertension in his mother.  ROS:   Please see the history of present illness.     All other systems reviewed and are negative.  EKGs/Labs/Other Studies  Reviewed:    The following studies were reviewed today:   EKG:  EKG is  ordered today.  The ekg ordered today demonstrates normal sinus rhythm, heart rate 76  Recent Labs: 09/26/2021: ALT 16; BUN 15; Creatinine, Ser 0.88; Hemoglobin 16.5; Platelets 204.0; Potassium 3.7; Sodium 139; TSH 2.19  Recent Lipid Panel    Component Value Date/Time   CHOL 189 09/26/2021 0756   TRIG 159.0 (H) 09/26/2021 0756   HDL 43.80 09/26/2021 0756   CHOLHDL 4 09/26/2021 0756   VLDL 31.8 09/26/2021 0756   LDLCALC 114 (H) 09/26/2021 0756     Risk Assessment/Calculations:          Physical Exam:    VS:  BP 120/80 (BP Location: Left Arm, Patient Position: Sitting, Cuff Size: Normal)   Pulse 64   Ht 6' 2.5" (1.892 m)   Wt 180 lb 2 oz (81.7 kg)   SpO2 97%   BMI 22.82 kg/m     Wt Readings from Last 3 Encounters:  02/17/22 180 lb 2 oz (81.7 kg)  02/16/22 180 lb (81.6 kg)  01/16/22 183 lb 3.2 oz (83.1 kg)     GEN:  Well nourished, well developed in no acute distress HEENT: Normal NECK: No JVD; No carotid bruits CARDIAC: RRR, no murmurs, rubs, gallops RESPIRATORY:  Clear to auscultation without rales, wheezing or rhonchi  ABDOMEN: Soft, non-tender, non-distended MUSCULOSKELETAL:  No edema; No deformity  SKIN: Warm and dry NEUROLOGIC:  Alert and oriented x 3 PSYCHIATRIC:  Normal affect   ASSESSMENT:    1. Mixed hyperlipidemia   2. Coronary artery calcification   3. Elevated BP without diagnosis of hypertension     PLAN:    In order of problems listed above:  Mixed hyperlipidemia, 10-year ASCVD risk 16.2%.  Coronary calcium score 72.4.  Start Lipitor 40 mg daily, recheck lipid panel in 3 months.  Elevated BP previously, blood pressure controlled today, controlled at home.  Continue low-salt diet, exercise, no indication to start BP meds at this time.  Follow-up in 6 months.      Medication Adjustments/Labs and Tests Ordered: Current medicines are reviewed at length with the  patient today.  Concerns regarding medicines are outlined above.  Orders Placed This Encounter  Procedures   Lipid panel   No orders of the defined types were placed in this encounter.   Patient Instructions  Medication Instructions:   Your physician recommends that you continue on your current medications as directed. Please refer to the Current Medication list given to you today.  *If you need a refill on your cardiac medications before your next appointment, please call your pharmacy*   Lab Work:  Your physician recommends that you return for a FASTING lipid profile: 3 months  - You will need to be fasting. Please do not have anything to eat or drink after midnight the morning you have the lab work. You may only have water or black coffee with no cream or sugar.   - Please go to the [facility]. You will check in at the front desk to the right as you walk into the atrium. Valet Parking is offered if needed. - No appointment needed. You may go any day between 7 am and 6 pm.     Follow-Up: At [facility], you and your health needs are our priority.  As part of our continuing mission to provide you with exceptional heart care, we have created designated Provider Care Teams.  These Care Teams include your primary Cardiologist (physician) and Advanced Practice Providers (APPs -  Physician Assistants and Nurse Practitioners) who all work together to provide you with the care you need, when you need it.  We recommend signing up for the patient portal called "MyChart".  Sign up information is provided on this After Visit Summary.  MyChart is used to connect with patients for Virtual Visits (Telemedicine).  Patients are able to view lab/test results, encounter notes, upcoming appointments, etc.  Non-urgent messages can be sent to your provider as well.   To learn more about what you can do with MyChart, go to [email or web address].    Your next appointment:   6  month(s)  The format for your next appointment:   In Person  Provider:   Kate Sable, MD    Other Instructions   Important Information About Sugar         Signed, Kate Sable, MD  02/17/2022 5:10 PM    [place]

## 2022-02-28 ENCOUNTER — Encounter: Payer: Self-pay | Admitting: Internal Medicine

## 2022-02-28 ENCOUNTER — Ambulatory Visit: Payer: Medicare PPO | Admitting: Internal Medicine

## 2022-02-28 VITALS — BP 123/79 | HR 62 | Temp 97.6°F | Ht 74.5 in | Wt 180.0 lb

## 2022-02-28 DIAGNOSIS — F5105 Insomnia due to other mental disorder: Secondary | ICD-10-CM

## 2022-02-28 DIAGNOSIS — N138 Other obstructive and reflux uropathy: Secondary | ICD-10-CM

## 2022-02-28 DIAGNOSIS — I10 Essential (primary) hypertension: Secondary | ICD-10-CM

## 2022-02-28 DIAGNOSIS — R911 Solitary pulmonary nodule: Secondary | ICD-10-CM | POA: Diagnosis not present

## 2022-02-28 DIAGNOSIS — R351 Nocturia: Secondary | ICD-10-CM | POA: Diagnosis not present

## 2022-02-28 DIAGNOSIS — L659 Nonscarring hair loss, unspecified: Secondary | ICD-10-CM | POA: Diagnosis not present

## 2022-02-28 DIAGNOSIS — N401 Enlarged prostate with lower urinary tract symptoms: Secondary | ICD-10-CM | POA: Diagnosis not present

## 2022-02-28 DIAGNOSIS — F409 Phobic anxiety disorder, unspecified: Secondary | ICD-10-CM

## 2022-02-28 DIAGNOSIS — E785 Hyperlipidemia, unspecified: Secondary | ICD-10-CM | POA: Diagnosis not present

## 2022-02-28 DIAGNOSIS — R918 Other nonspecific abnormal finding of lung field: Secondary | ICD-10-CM | POA: Insufficient documentation

## 2022-02-28 MED ORDER — ATORVASTATIN CALCIUM 20 MG PO TABS: 20.0000 mg | ORAL_TABLET | Freq: Every day | ORAL | 3 refills | Status: DC

## 2022-02-28 NOTE — Assessment & Plan Note (Addendum)
Behavioral.  No BPH.  Resolved without tamsulosin

## 2022-02-28 NOTE — Assessment & Plan Note (Addendum)
He has been taking  hctz 12.5 mg and home readings have been < 130/80.  He has not had any readings below  110/70.  No changes today

## 2022-02-28 NOTE — Progress Notes (Unsigned)
Subjective:  Patient ID: Bobby Mills, male    DOB: 30-Apr-1956  Age: 66 y.o. MRN: [id number]  CC: There were no encounter diagnoses.   HPI Christ Fullenwider presents for  Chief Complaint  Patient presents with   Follow-up    Follow up on hypertension   1) Hypertension: patient checks blood pressure daily at home.  Readings have been for the most part < 130/80 at rest . Diastolic runs low to mid 81'E at home systolics are under 751.   Patient is following a reduced salt diet most days and is taking medications as prescribed   2) Nocturia:  urology evaluation reviewed.  He is voiding less at night - zero to 1 times per night  without use of tamsulosin  3 ) RLS;  USES xanax to help.  Not occurring nightly  Outpatient Medications Prior to Visit  Medication Sig Dispense Refill   ALPRAZolam (XANAX) 0.25 MG tablet Take 0.5 tablets (0.125 mg total) by mouth daily as needed for anxiety or sleep. 20 tablet 0   finasteride (PROPECIA) 1 MG tablet Take 1 mg by mouth daily.     minoxidil (LONITEN) 2.5 MG tablet Take 0.5 tablets (1.25 mg total) by mouth daily. 30 tablet 3   Red Yeast Rice 600 MG CAPS Take 1,200 mg by mouth daily.     triamcinolone (KENALOG) 0.1 % Apply topically 2 (two) times daily.     atorvastatin (LIPITOR) 40 MG tablet Take 1 tablet (40 mg total) by mouth daily. (Patient not taking: Reported on 02/28/2022) 30 tablet 3   tamsulosin (FLOMAX) 0.4 MG CAPS capsule Take 1 capsule (0.4 mg total) by mouth daily. (Patient not taking: Reported on 02/28/2022) 30 capsule 11   No facility-administered medications prior to visit.    Review of Systems;  Patient denies headache, fevers, malaise, unintentional weight loss, skin rash, eye pain, sinus congestion and sinus pain, sore throat, dysphagia,  hemoptysis , cough, dyspnea, wheezing, chest pain, palpitations, orthopnea, edema, abdominal pain, nausea, melena, diarrhea, constipation, flank pain, dysuria, hematuria, urinary  Frequency, nocturia,  numbness, tingling, seizures,  Focal weakness, Loss of consciousness,  Tremor, insomnia, depression, anxiety, and suicidal ideation.      Objective:  BP 123/79   Pulse 62   Temp 97.6 F (36.4 C) (Oral)   Ht 6' 2.5" (1.892 m)   Wt 180 lb (81.6 kg)   SpO2 97%   BMI 22.80 kg/m   BP Readings from Last 3 Encounters:  02/28/22 123/79  02/17/22 120/80  02/16/22 (!) 148/86    Wt Readings from Last 3 Encounters:  02/28/22 180 lb (81.6 kg)  02/17/22 180 lb 2 oz (81.7 kg)  02/16/22 180 lb (81.6 kg)    General appearance: alert, cooperative and appears stated age Ears: normal TM's and external ear canals both ears Throat: lips, mucosa, and tongue normal; teeth and gums normal Neck: no adenopathy, no carotid bruit, supple, symmetrical, trachea midline and thyroid not enlarged, symmetric, no tenderness/mass/nodules Back: symmetric, no curvature. ROM normal. No CVA tenderness. Lungs: clear to auscultation bilaterally Heart: regular rate and rhythm, S1, S2 normal, no murmur, click, rub or gallop Abdomen: soft, non-tender; bowel sounds normal; no masses,  no organomegaly Pulses: 2+ and symmetric Skin: Skin color, texture, turgor normal. No rashes or lesions Lymph nodes: Cervical, supraclavicular, and axillary nodes normal. Neuro:  awake and interactive with normal mood and affect. Higher cortical functions are normal. Speech is clear without word-finding difficulty or dysarthria. Extraocular movements are intact. Visual fields  of both eyes are grossly intact. Sensation to light touch is grossly intact bilaterally of upper and lower extremities. Motor examination shows 4+/5 symmetric hand grip and upper extremity and 5/5 lower extremity strength. There is no pronation or drift. Gait is non-ataxic   No results found for: "HGBA1C"  Lab Results  Component Value Date   CREATININE 0.88 09/26/2021   CREATININE 0.96 06/23/2021   CREATININE 0.90 09/24/2020    Lab Results  Component Value Date    WBC 4.9 09/26/2021   HGB 16.5 09/26/2021   HCT 47.4 09/26/2021   PLT 204.0 09/26/2021   GLUCOSE 80 09/26/2021   CHOL 189 09/26/2021   TRIG 159.0 (H) 09/26/2021   HDL 43.80 09/26/2021   LDLCALC 114 (H) 09/26/2021   ALT 16 09/26/2021   AST 19 09/26/2021   NA 139 09/26/2021   K 3.7 09/26/2021   CL 101 09/26/2021   CREATININE 0.88 09/26/2021   BUN 15 09/26/2021   CO2 30 09/26/2021   TSH 2.19 09/26/2021   PSA 0.07 (L) 09/26/2021   MICROALBUR 1.0 06/23/2021    CT CARDIAC SCORING (SELF PAY ONLY)  Addendum Date: 01/25/2022   ADDENDUM REPORT: 01/25/2022 15:52 EXAM: OVER-READ INTERPRETATION  CT CHEST The following report is an over-read performed by radiologist Dr. Sabino Dick [facility] on 01/25/2022. This over-read does not include interpretation of cardiac or coronary anatomy or pathology. The coronary calcium score interpretation by the cardiologist is attached. COMPARISON:  None. FINDINGS: The visualized portions of the extracardiac vascular structures are unremarkable. Visualized mediastinum is unremarkable. Visualized portion of upper abdomen is unremarkable. 4 mm nodule is noted in right upper lobe best seen on image number 29 of series 8. Visualized skeleton is unremarkable. IMPRESSION: 4 mm nodule seen in right upper lobe. Although likely benign, if the patient is high-risk, given the morphology and/or location of this nodule a non-contrast chest CT can be considered in 12 months.This recommendation follows the consensus statement: Guidelines for Management of Incidental Pulmonary Nodules Detected on CT Images: From the Fleischner Society 2017; Radiology 2017; 284:228-243. Electronically Signed   By: Marijo Conception M.D.   On: 01/25/2022 15:52   Result Date: 01/25/2022 CLINICAL DATA:  Risk stratification EXAM: Coronary Calcium Score TECHNIQUE: The patient was scanned on a Siemens Somatom go.Top Scanner. Axial non-contrast 3 mm slices were carried out through the heart. The  data set was analyzed on a dedicated work station and scored using the [place]. FINDINGS: Non-cardiac: See separate report from [facility]. Ascending Aorta: Normal size Pericardium: Normal Coronary arteries: Normal origin of left and right coronary arteries. Distribution of arterial calcifications if present, as noted below; LM 0 LAD 72.4 LCx 0 RCA 0 Total 72.4 IMPRESSION AND RECOMMENDATION: 1. Coronary calcium score of 72.4. This was 50th percentile for age and sex matched control. 2. CAC 1-99 in LAD. CAC-DRS A1/N1. 3. Continue heart healthy lifestyle and risk factor modification. Electronically Signed: By: Kate Sable M.D. On: 01/17/2022 09:22    Assessment & Plan:   Problem List Items Addressed This Visit   None   I spent a total of   minutes with this patient in a face to face visit on the date of this encounter reviewing the last office visit with me in       ,  most recent visit with cardiology ,    ,  patient's diet and exercise habits, home blood pressure /blod sugar readings, recent ER visit including labs and imaging  studies ,   and post visit ordering of testing and therapeutics.    Follow-up: No follow-ups on file.   Crecencio Mc, MD

## 2022-02-28 NOTE — Assessment & Plan Note (Signed)
Given his elevated calcium risk score of 72,  Agree with trial of atorvastatin

## 2022-02-28 NOTE — Patient Instructions (Addendum)
Continue the hctz .    Do not check BP more than once a week   Do not start the tamsulosin  since your situation has improved   Continue minoxidil   Start atorvastatin at a lower dose of 20 mg daily  Return in 3 weeks for non fasting labs

## 2022-02-28 NOTE — Assessment & Plan Note (Signed)
Taking oral minoxidil and finasteride (prescribed by Dr Evorn Gong)

## 2022-02-28 NOTE — Assessment & Plan Note (Signed)
Prescribed tamsulosin by urology after comprehensive evaluation of [PSA , PVR, urinalysis .

## 2022-03-01 ENCOUNTER — Other Ambulatory Visit: Payer: Self-pay | Admitting: Internal Medicine

## 2022-03-01 NOTE — Assessment & Plan Note (Signed)
encouraged to exercise regularly (daily) and stop checking BP on a daily basis

## 2022-03-01 NOTE — Assessment & Plan Note (Signed)
He has a remote history fo tobacco use , referring to the pulmonary nodule clinic

## 2022-03-16 ENCOUNTER — Ambulatory Visit: Payer: Medicare PPO | Admitting: Urology

## 2022-03-20 ENCOUNTER — Institutional Professional Consult (permissible substitution): Payer: Medicare PPO | Admitting: Student in an Organized Health Care Education/Training Program

## 2022-03-21 ENCOUNTER — Other Ambulatory Visit (INDEPENDENT_AMBULATORY_CARE_PROVIDER_SITE_OTHER): Payer: Medicare PPO

## 2022-03-21 DIAGNOSIS — E785 Hyperlipidemia, unspecified: Secondary | ICD-10-CM | POA: Diagnosis not present

## 2022-03-21 LAB — COMPREHENSIVE METABOLIC PANEL
ALT: 16 U/L (ref 0–53)
AST: 23 U/L (ref 0–37)
Albumin: 4.3 g/dL (ref 3.5–5.2)
Alkaline Phosphatase: 67 U/L (ref 39–117)
BUN: 18 mg/dL (ref 6–23)
CO2: 30 mEq/L (ref 19–32)
Calcium: 9.5 mg/dL (ref 8.4–10.5)
Chloride: 101 mEq/L (ref 96–112)
Creatinine, Ser: 0.98 mg/dL (ref 0.40–1.50)
GFR: 81.04 mL/min (ref >60.00)
Glucose, Bld: 80 mg/dL (ref 70–99)
Potassium: 3.6 mEq/L (ref 3.5–5.1)
Sodium: 139 mEq/L (ref 135–145)
Total Bilirubin: 0.8 mg/dL (ref 0.2–1.2)
Total Protein: 6.8 g/dL (ref 6.0–8.3)

## 2022-04-06 DIAGNOSIS — D485 Neoplasm of uncertain behavior of skin: Secondary | ICD-10-CM | POA: Diagnosis not present

## 2022-04-06 DIAGNOSIS — L648 Other androgenic alopecia: Secondary | ICD-10-CM | POA: Diagnosis not present

## 2022-04-06 DIAGNOSIS — Z85828 Personal history of other malignant neoplasm of skin: Secondary | ICD-10-CM | POA: Diagnosis not present

## 2022-04-06 DIAGNOSIS — L570 Actinic keratosis: Secondary | ICD-10-CM | POA: Diagnosis not present

## 2022-04-06 DIAGNOSIS — X32XXXA Exposure to sunlight, initial encounter: Secondary | ICD-10-CM | POA: Diagnosis not present

## 2022-04-06 DIAGNOSIS — B351 Tinea unguium: Secondary | ICD-10-CM | POA: Diagnosis not present

## 2022-04-06 DIAGNOSIS — D2271 Melanocytic nevi of right lower limb, including hip: Secondary | ICD-10-CM | POA: Diagnosis not present

## 2022-04-06 DIAGNOSIS — D2261 Melanocytic nevi of right upper limb, including shoulder: Secondary | ICD-10-CM | POA: Diagnosis not present

## 2022-04-17 ENCOUNTER — Encounter (INDEPENDENT_AMBULATORY_CARE_PROVIDER_SITE_OTHER): Payer: Self-pay

## 2022-07-03 ENCOUNTER — Other Ambulatory Visit: Payer: Self-pay | Admitting: Family

## 2022-07-10 ENCOUNTER — Encounter: Payer: Self-pay | Admitting: Internal Medicine

## 2022-08-16 ENCOUNTER — Ambulatory Visit: Payer: Medicare PPO | Admitting: Podiatry

## 2022-08-16 ENCOUNTER — Encounter: Payer: Self-pay | Admitting: Podiatry

## 2022-08-16 DIAGNOSIS — L603 Nail dystrophy: Secondary | ICD-10-CM

## 2022-08-16 DIAGNOSIS — B351 Tinea unguium: Secondary | ICD-10-CM | POA: Diagnosis not present

## 2022-08-16 NOTE — Progress Notes (Signed)
  Subjective:  Patient ID: Bobby Mills, male    DOB: 26-Aug-1955,  MRN: [id number] HPI Chief Complaint  Patient presents with   Nail Problem    Toenails bilateral - nails get thick and hard, was on lamisil years ago, got better, but now they are thick again, starting to curve in a little bit as well   New Patient (Initial Visit)    67 y.o. male presents with the above complaint.   ROS: Denies fever chills nausea vomit muscle aches pains calf pain back pain chest pain shortness of breath.  Past Medical History:  Diagnosis Date   Allergy    seasonal   History of colon polyps    No past surgical history on file.  Current Outpatient Medications:    ALPRAZolam (XANAX) 0.25 MG tablet, TAKE 0.5 TABLET BY MOUTH DAILY AS NEEDEDFOR ANXIETY OR SLEEP., Disp: 20 tablet, Rfl: 0   atorvastatin (LIPITOR) 20 MG tablet, Take 1 tablet (20 mg total) by mouth daily., Disp: 90 tablet, Rfl: 3   finasteride (PROPECIA) 1 MG tablet, Take 1 mg by mouth daily., Disp: , Rfl:    hydrochlorothiazide (MICROZIDE) 12.5 MG capsule, TAKE 1 CAPSULE BY MOUTH DAILY, Disp: 90 capsule, Rfl: 1   minoxidil (LONITEN) 2.5 MG tablet, Take 0.5 tablets (1.25 mg total) by mouth daily., Disp: 30 tablet, Rfl: 3   triamcinolone (KENALOG) 0.1 %, Apply topically 2 (two) times daily., Disp: , Rfl:   No Known Allergies Review of Systems Objective:  There were no vitals filed for this visit.  General: Well developed, nourished, in no acute distress, alert and oriented x3   Dermatological: Skin is warm, dry and supple bilateral. Nails x 10 are well maintained; remaining integument appears unremarkable at this time. There are no open sores, no preulcerative lesions, no rash or signs of infection present.  Hallux right demonstrates along the fibular border thick white crumbly powdery type thickness in the distal lateral margin extending proximally.  This is consistent with nail dystrophy or onychomycosis.  Vascular: Dorsalis Pedis  artery and Posterior Tibial artery pedal pulses are 2/4 bilateral with immedate capillary fill time. Pedal hair growth present. No varicosities and no lower extremity edema present bilateral.   Neruologic: Grossly intact via light touch bilateral. Vibratory intact via tuning fork bilateral. Protective threshold with Semmes Wienstein monofilament intact to all pedal sites bilateral. Patellar and Achilles deep tendon reflexes 2+ bilateral. No Babinski or clonus noted bilateral.   Musculoskeletal: No gross boney pedal deformities bilateral. No pain, crepitus, or limitation noted with foot and ankle range of motion bilateral. Muscular strength 5/5 in all groups tested bilateral.  Gait: Unassisted, Nonantalgic.    Radiographs:  None taken  Assessment & Plan:   Assessment: Onychomycosis nail dystrophy.  Plan: Took samples to be sent for pathologic evaluation we will follow-up with him in 4 to 6 weeks     Shikira Folino T. [place], [place]

## 2022-08-29 ENCOUNTER — Ambulatory Visit: Payer: Medicare PPO | Admitting: Internal Medicine

## 2022-08-31 ENCOUNTER — Other Ambulatory Visit: Payer: Self-pay | Admitting: Family

## 2022-09-29 ENCOUNTER — Encounter: Payer: Self-pay | Admitting: Internal Medicine

## 2022-09-29 ENCOUNTER — Ambulatory Visit (INDEPENDENT_AMBULATORY_CARE_PROVIDER_SITE_OTHER): Payer: Medicare PPO | Admitting: Internal Medicine

## 2022-09-29 VITALS — BP 120/78 | HR 56 | Temp 97.7°F | Ht 74.5 in | Wt 183.4 lb

## 2022-09-29 DIAGNOSIS — I10 Essential (primary) hypertension: Secondary | ICD-10-CM | POA: Diagnosis not present

## 2022-09-29 DIAGNOSIS — Z0000 Encounter for general adult medical examination without abnormal findings: Secondary | ICD-10-CM

## 2022-09-29 DIAGNOSIS — E785 Hyperlipidemia, unspecified: Secondary | ICD-10-CM

## 2022-09-29 DIAGNOSIS — Z125 Encounter for screening for malignant neoplasm of prostate: Secondary | ICD-10-CM

## 2022-09-29 DIAGNOSIS — Z8249 Family history of ischemic heart disease and other diseases of the circulatory system: Secondary | ICD-10-CM | POA: Diagnosis not present

## 2022-09-29 DIAGNOSIS — R5383 Other fatigue: Secondary | ICD-10-CM | POA: Diagnosis not present

## 2022-09-29 DIAGNOSIS — G2581 Restless legs syndrome: Secondary | ICD-10-CM | POA: Diagnosis not present

## 2022-09-29 DIAGNOSIS — Z23 Encounter for immunization: Secondary | ICD-10-CM

## 2022-09-29 DIAGNOSIS — G4701 Insomnia due to medical condition: Secondary | ICD-10-CM

## 2022-09-29 DIAGNOSIS — R911 Solitary pulmonary nodule: Secondary | ICD-10-CM

## 2022-09-29 LAB — COMPREHENSIVE METABOLIC PANEL
ALT: 19 U/L (ref 0–53)
AST: 21 U/L (ref 0–37)
Albumin: 4.4 g/dL (ref 3.5–5.2)
Alkaline Phosphatase: 76 U/L (ref 39–117)
BUN: 14 mg/dL (ref 6–23)
CO2: 31 mEq/L (ref 19–32)
Calcium: 9.4 mg/dL (ref 8.4–10.5)
Chloride: 101 mEq/L (ref 96–112)
Creatinine, Ser: 0.93 mg/dL (ref 0.40–1.50)
GFR: 85.98 mL/min (ref >60.00)
Glucose, Bld: 82 mg/dL (ref 70–99)
Potassium: 4 mEq/L (ref 3.5–5.1)
Sodium: 139 mEq/L (ref 135–145)
Total Bilirubin: 1 mg/dL (ref 0.2–1.2)
Total Protein: 6.9 g/dL (ref 6.0–8.3)

## 2022-09-29 LAB — HEMOGLOBIN A1C: Hgb A1c MFr Bld: 5.3 % (ref 4.6–6.5)

## 2022-09-29 LAB — LIPID PANEL
Cholesterol: 127 mg/dL (ref 0–200)
HDL: 49 mg/dL (ref >39.00)
LDL Cholesterol: 62 mg/dL (ref 0–99)
NonHDL: 78.24
Total CHOL/HDL Ratio: 3
Triglycerides: 79 mg/dL (ref 0.0–149.0)
VLDL: 15.8 mg/dL (ref 0.0–40.0)

## 2022-09-29 LAB — IBC + FERRITIN
Ferritin: 110.3 ng/mL (ref 22.0–322.0)
Iron: 160 ug/dL (ref 42–165)
Saturation Ratios: 59.5 % — ABNORMAL HIGH (ref 20.0–50.0)
TIBC: 268.8 ug/dL (ref 250.0–450.0)
Transferrin: 192 mg/dL — ABNORMAL LOW (ref 212.0–360.0)

## 2022-09-29 LAB — CBC WITH DIFFERENTIAL/PLATELET
Basophils Absolute: 0.1 10*3/uL (ref 0.0–0.1)
Basophils Relative: 1.2 % (ref 0.0–3.0)
Eosinophils Absolute: 0.1 10*3/uL (ref 0.0–0.7)
Eosinophils Relative: 1.6 % (ref 0.0–5.0)
HCT: 49.2 % (ref 39.0–52.0)
Hemoglobin: 16.9 g/dL (ref 13.0–17.0)
Lymphocytes Relative: 30.1 % (ref 12.0–46.0)
Lymphs Abs: 1.3 10*3/uL (ref 0.7–4.0)
MCHC: 34.3 g/dL (ref 30.0–36.0)
MCV: 94.8 fl (ref 78.0–100.0)
Monocytes Absolute: 0.5 10*3/uL (ref 0.1–1.0)
Monocytes Relative: 10.2 % (ref 3.0–12.0)
Neutro Abs: 2.5 10*3/uL (ref 1.4–7.7)
Neutrophils Relative %: 56.9 % (ref 43.0–77.0)
Platelets: 224 10*3/uL (ref 150.0–400.0)
RBC: 5.18 Mil/uL (ref 4.22–5.81)
RDW: 13.2 % (ref 11.5–15.5)
WBC: 4.4 10*3/uL (ref 4.0–10.5)

## 2022-09-29 LAB — PSA, MEDICARE: PSA: 0.33 ng/ml (ref 0.10–4.00)

## 2022-09-29 LAB — LDL CHOLESTEROL, DIRECT: Direct LDL: 68 mg/dL

## 2022-09-29 LAB — TSH: TSH: 1.56 u[IU]/mL (ref 0.35–5.50)

## 2022-09-29 MED ORDER — ALPRAZOLAM 0.25 MG PO TABS: ORAL_TABLET | ORAL | 5 refills | Status: DC

## 2022-09-29 MED ORDER — HYDROCHLOROTHIAZIDE 12.5 MG PO CAPS: 12.5000 mg | ORAL_CAPSULE | Freq: Every day | ORAL | 1 refills | Status: DC

## 2022-09-29 NOTE — Assessment & Plan Note (Signed)
Tolerating atorvastatin  But at lower dose than recommended by cardiology . Repeat lipids are due   Lab Results  Component Value Date   CHOL 189 09/26/2021   HDL 43.80 09/26/2021   LDLCALC 114 (H) 09/26/2021   TRIG 159.0 (H) 09/26/2021   CHOLHDL 4 09/26/2021

## 2022-09-29 NOTE — Assessment & Plan Note (Signed)
Managed with infrequent use of alprazolam.  Iron studies were done in 2018 ,  will be repeated today

## 2022-09-29 NOTE — Progress Notes (Signed)
Patient ID: Bobby Mills, male    DOB: Jan 07, 1956  Age: 67 y.o. MRN: [id number]  The patient is here for annual preventive examination and management of other chronic and acute problems.   The risk factors are reflected in the social history.   The roster of all physicians providing medical care to patient - is listed in the Snapshot section of the chart.   Activities of daily living:  The patient is 100% independent in all ADLs: dressing, toileting, feeding as well as independent mobility   Home safety : The patient has smoke detectors in the home. They wear seatbelts.  There are no unsecured firearms at home. There is no violence in the home.    There is no risks for hepatitis, STDs or HIV. There is no   history of blood transfusion. They have no travel history to infectious disease endemic areas of the world.   The patient has seen their dentist in the last six month. They have seen their eye doctor in the last year. The patinet  denies slight hearing difficulty with regard to whispered voices and some television programs.  They have deferred audiologic testing in the last year.  They do not  have excessive sun exposure. Discussed the need for sun protection: hats, long sleeves and use of sunscreen if there is significant sun exposure.    Diet: the importance of a healthy diet is discussed. They do have a healthy diet.   The benefits of regular aerobic exercise were discussed. The patient  exercises  3 to 5 days per week  for  60 minutes.    Depression screen: there are no signs or vegative symptoms of depression- irritability, change in appetite, anhedonia, sadness/tearfullness.   The following portions of the patient's history were reviewed and updated as appropriate: allergies, current medications, past family history, past medical history,  past surgical history, past social history  and problem list.   Visual acuity was not assessed per patient preference since the patient has regular  follow up with an  ophthalmologist. Hearing and body mass index were assessed and reviewed.    During the course of the visit the patient was educated and counseled about appropriate screening and preventive services including : fall prevention , diabetes screening, nutrition counseling, colorectal cancer screening, and recommended immunizations.    Chief Complaint:  HPI     Medical Management of Chronic Issues    Additional comments: Yearly follow up       Last edited by Sandy Salaam, CMA on 09/29/2022  9:20 AM.        Review of Symptoms  Patient denies headache, fevers, malaise, unintentional weight loss, skin rash, eye pain, sinus congestion and sinus pain, sore throat, dysphagia,  hemoptysis , cough, dyspnea, wheezing, chest pain, palpitations, orthopnea, edema, abdominal pain, nausea, melena, diarrhea, constipation, flank pain, dysuria, hematuria, urinary  Frequency, nocturia, numbness, tingling, seizures,  Focal weakness, Loss of consciousness,  Tremor, insomnia, depression, anxiety, and suicidal ideation.    Physical Exam:  BP 120/78   Pulse (!) 56   Temp 97.7 F (36.5 C) (Oral)   Ht 6' 2.5" (1.892 m)   Wt 183 lb 6.4 oz (83.2 kg)   SpO2 99%   BMI 23.23 kg/m    Physical Exam Vitals reviewed.  Constitutional:      General: He is not in acute distress.    Appearance: Normal appearance. He is normal weight. He is not ill-appearing, toxic-appearing or diaphoretic.  HENT:  Head: Normocephalic and atraumatic.     Right Ear: Tympanic membrane, ear canal and external ear normal. There is no impacted cerumen.     Left Ear: Tympanic membrane, ear canal and external ear normal. There is no impacted cerumen.     Nose: Nose normal.     Mouth/Throat:     Mouth: Mucous membranes are moist.     Pharynx: Oropharynx is clear.  Eyes:     General: No scleral icterus.       Right eye: No discharge.        Left eye: No discharge.     Conjunctiva/sclera: Conjunctivae normal.   Neck:     Thyroid: No thyromegaly.     Vascular: No carotid bruit or JVD.  Cardiovascular:     Rate and Rhythm: Normal rate and regular rhythm.     Heart sounds: Normal heart sounds.  Pulmonary:     Effort: Pulmonary effort is normal. No respiratory distress.     Breath sounds: Normal breath sounds.  Abdominal:     General: Bowel sounds are normal.     Palpations: Abdomen is soft. There is no mass.     Tenderness: There is no abdominal tenderness. There is no guarding or rebound.  Musculoskeletal:        General: Normal range of motion.     Cervical back: Normal range of motion and neck supple.  Lymphadenopathy:     Cervical: No cervical adenopathy.  Skin:    General: Skin is warm and dry.  Neurological:     General: No focal deficit present.     Mental Status: He is alert and oriented to person, place, and time. Mental status is at baseline.  Psychiatric:        Mood and Affect: Mood normal.        Behavior: Behavior normal.        Thought Content: Thought content normal.        Judgment: Judgment normal.    Assessment and Plan: Encounter for preventive health examination Assessment & Plan: age appropriate education and counseling updated, referrals for preventative services and immunizations addressed, dietary and smoking counseling addressed, most recent labs reviewed.  I have personally reviewed and have noted:   1) the patient's medical and social history 2) The pt's use of alcohol, tobacco, and illicit drugs 3) The patient's current medications and supplements 4) Functional ability including ADL's, fall risk, home safety risk, hearing and visual impairment 5) Diet and physical activities 6) Evidence for depression or mood disorder 7) The patient's height, weight, and BMI have been recorded in the chart     I have made referrals, and provided counseling and education based on review of the above    Essential hypertension Assessment & Plan: Taking 12.5 mg hctz.   Readings are all < 140/90 and mostly < 130/80.  No changes today  Orders: -     Comprehensive metabolic panel  Hyperlipidemia LDL goal <130 Assessment & Plan: Tolerating atorvastatin  But at lower dose than recommended by cardiology . Repeat lipids are due   Lab Results  Component Value Date   CHOL 189 09/26/2021   HDL 43.80 09/26/2021   LDLCALC 114 (H) 09/26/2021   TRIG 159.0 (H) 09/26/2021   CHOLHDL 4 09/26/2021     Orders: -     Lipid panel -     LDL cholesterol, direct -     Hemoglobin A1c  Prostate cancer screening -  PSA, Medicare  Fatigue, unspecified type -     CBC with Differential/Platelet -     TSH  Restless legs -     IBC + Ferritin  Solitary pulmonary nodule Assessment & Plan: 4 mm nodule in RUL was seen on July 2023 Coronary calcium CT.  He has a remote history of tobacco use .  He was referred to the  the pulmonary nodule clinic in September 2023 and has not been seen yet.    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  Iron studies were done in 2018 ,  will be repeated today    Family history of abdominal aortic aneurysm (AAA) Assessment & Plan: His AA Ultrasound was normal in 2022 and BP has been well controlled.    Other orders -     hydroCHLOROthiazide; Take 1 capsule (12.5 mg total) by mouth daily.  Dispense: 90 capsule; Refill: 1 -     ALPRAZolam; TAKE 0.5 TABLET BY MOUTH DAILY AS NEEDEDFOR ANXIETY OR SLEEP.  Dispense: 20 tablet; Refill: 5 -     Pneumococcal conjugate vaccine 20-valent    Return in about 6 months (around 03/31/2023).  Sherlene Shams, MD

## 2022-09-29 NOTE — Assessment & Plan Note (Addendum)
Taking 12.5 mg hctz.  Readings are all < 140/90 and mostly < 130/80.  No changes today

## 2022-09-29 NOTE — Assessment & Plan Note (Addendum)
4 mm nodule in RUL was seen on July 2023 Coronary calcium CT.  He has a remote history of tobacco use .  He was referred to the  the pulmonary nodule clinic in September 2023 and has not been seen yet.

## 2022-09-29 NOTE — Assessment & Plan Note (Signed)
His AA Ultrasound was normal in 2022 and BP has been well controlled.

## 2022-09-29 NOTE — Patient Instructions (Addendum)
I recommend supplementing your diet with Algae cal for calcium supplement  1200 mg max daily ( get 1/2 through diet) is your goal   We will repeat bone density (DEXA) in 2026  Prevnar 20 given today;  your "one and done pneumonia vaccine)

## 2022-09-29 NOTE — Assessment & Plan Note (Signed)

## 2022-10-04 ENCOUNTER — Ambulatory Visit: Payer: Medicare PPO | Admitting: Podiatry

## 2022-10-04 ENCOUNTER — Encounter: Payer: Self-pay | Admitting: Podiatry

## 2022-10-04 DIAGNOSIS — L603 Nail dystrophy: Secondary | ICD-10-CM

## 2022-10-04 MED ORDER — TERBINAFINE HCL 250 MG PO TABS: 250.0000 mg | ORAL_TABLET | Freq: Every day | ORAL | 0 refills | Status: DC

## 2022-10-04 NOTE — Progress Notes (Signed)
Bobby Mills presents today for follow-up of his pathology results regarding his toenails.  Objective: Vital signs are stable alert and oriented x 3.  Pulses are palpable.  He has pathology does demonstrate saprophytic fungus as well as nail dystrophy.  Assessment: Onychomycosis nail dystrophy.  Plan: Discussed laser treatment and oral therapy he would like to try oral therapy started him on Lamisil because he stated that he cleared his nails once before.  He had recent blood work done demonstrated normal values.  Will start him on Lamisil 250 mg tablets.  He will take 1 tablet daily.  Follow-up with him in 30 days.

## 2022-11-01 ENCOUNTER — Encounter: Payer: Self-pay | Admitting: Podiatry

## 2022-11-01 ENCOUNTER — Ambulatory Visit: Payer: Medicare PPO | Admitting: Podiatry

## 2022-11-01 DIAGNOSIS — Z79899 Other long term (current) drug therapy: Secondary | ICD-10-CM

## 2022-11-01 DIAGNOSIS — L603 Nail dystrophy: Secondary | ICD-10-CM | POA: Diagnosis not present

## 2022-11-01 MED ORDER — TERBINAFINE HCL 250 MG PO TABS: 250.0000 mg | ORAL_TABLET | Freq: Every day | ORAL | 0 refills | Status: DC

## 2022-11-01 NOTE — Progress Notes (Signed)
He presents today for follow-up of his nail fungus he has been taking Lamisil for 30 days and has had no side effects.  He denies fever chills nausea vomit muscle aches pains calf pain back pain chest pain shortness of breath.  Objective: No change in physical exam onychomycosis.  Assessment: Onychomycosis long-term therapy with Lamisil.  Plan: Requesting a comprehensive metabolic panel and going to write him another 90 days of Lamisil.  Should there be questions or concerns she will direct that toward [place] I will follow-up with him with his blood work should it come back abnormal.

## 2022-11-02 LAB — COMPREHENSIVE METABOLIC PANEL
ALT: 29 IU/L (ref 0–44)
AST: 26 IU/L (ref 0–40)
Albumin/Globulin Ratio: 1.8 (ref 1.2–2.2)
Albumin: 4.4 g/dL (ref 3.9–4.9)
Alkaline Phosphatase: 88 IU/L (ref 44–121)
BUN/Creatinine Ratio: 18 (ref 10–24)
BUN: 17 mg/dL (ref 8–27)
Bilirubin Total: 0.6 mg/dL (ref 0.0–1.2)
CO2: 25 mmol/L (ref 20–29)
Calcium: 9.6 mg/dL (ref 8.6–10.2)
Chloride: 101 mmol/L (ref 96–106)
Creatinine, Ser: 0.94 mg/dL (ref 0.76–1.27)
Globulin, Total: 2.4 g/dL (ref 1.5–4.5)
Glucose: 75 mg/dL (ref 70–99)
Potassium: 4.3 mmol/L (ref 3.5–5.2)
Sodium: 139 mmol/L (ref 134–144)
Total Protein: 6.8 g/dL (ref 6.0–8.5)
eGFR: 90 mL/min/{1.73_m2} (ref >59)

## 2022-11-07 ENCOUNTER — Telehealth: Payer: Self-pay | Admitting: *Deleted

## 2022-11-07 NOTE — Telephone Encounter (Signed)
Patient has been updated.

## 2022-11-07 NOTE — Telephone Encounter (Signed)
-----   Message from Kristian Covey, [facility] sent at 11/07/2022  8:03 AM EDT -----  ----- Message ----- From: Elinor Parkinson, DPM Sent: 11/07/2022   7:28 AM EDT To: [employer], PMAC  Good continue with medicaiton;

## 2022-11-08 ENCOUNTER — Ambulatory Visit (INDEPENDENT_AMBULATORY_CARE_PROVIDER_SITE_OTHER): Payer: Medicare PPO | Admitting: Internal Medicine

## 2022-11-08 ENCOUNTER — Encounter: Payer: Self-pay | Admitting: Internal Medicine

## 2022-11-08 VITALS — BP 124/80 | HR 62 | Temp 98.4°F | Ht 74.5 in | Wt 182.2 lb

## 2022-11-08 DIAGNOSIS — Z125 Encounter for screening for malignant neoplasm of prostate: Secondary | ICD-10-CM

## 2022-11-08 DIAGNOSIS — W57XXXA Bitten or stung by nonvenomous insect and other nonvenomous arthropods, initial encounter: Secondary | ICD-10-CM

## 2022-11-08 DIAGNOSIS — I10 Essential (primary) hypertension: Secondary | ICD-10-CM

## 2022-11-08 DIAGNOSIS — S90869A Insect bite (nonvenomous), unspecified foot, initial encounter: Secondary | ICD-10-CM

## 2022-11-08 DIAGNOSIS — R790 Abnormal level of blood mineral: Secondary | ICD-10-CM

## 2022-11-08 DIAGNOSIS — Z0000 Encounter for general adult medical examination without abnormal findings: Secondary | ICD-10-CM

## 2022-11-08 DIAGNOSIS — S90862A Insect bite (nonvenomous), left foot, initial encounter: Secondary | ICD-10-CM | POA: Diagnosis not present

## 2022-11-08 DIAGNOSIS — B351 Tinea unguium: Secondary | ICD-10-CM | POA: Insufficient documentation

## 2022-11-08 DIAGNOSIS — E785 Hyperlipidemia, unspecified: Secondary | ICD-10-CM

## 2022-11-08 MED ORDER — PREDNISONE 10 MG PO TABS: ORAL_TABLET | ORAL | 0 refills | Status: DC

## 2022-11-08 MED ORDER — DOXYCYCLINE HYCLATE 100 MG PO TABS
100.0000 mg | ORAL_TABLET | Freq: Two times a day (BID) | ORAL | 0 refills | Status: DC
Start: 2022-11-08 — End: 2022-12-13

## 2022-11-08 NOTE — Assessment & Plan Note (Signed)

## 2022-11-08 NOTE — Assessment & Plan Note (Signed)
He has a large bullous on the medical side of his left foot that developed 48 hours ago after walking in his yard in sandals.  The area itches but does not hurt.  He has had previous reactions to insect bites  that were less pronounced.  Doxy and predniosone   prescribed,  allegra qid for itching

## 2022-11-08 NOTE — Progress Notes (Unsigned)
The patient is here for the Welcome to  Medicare  preventive visit     has a past medical history of Allergy and History of colon polyps.    reports that he has quit smoking. His smoking use included cigarettes. He has a 3.00 pack-year smoking history. He has been exposed to tobacco smoke. He has never used smokeless tobacco. He reports that he does not drink alcohol and does not use drugs.   The roster of all physicians providing medical care to patient : Patient Care Team: Sherlene Shams, MD as PCP - General (Internal Medicine) Debbe Odea, MD as PCP - Cardiology (Cardiology)  Activities of daily living:  The patient is 100% independent in all ADLs: dressing, toileting, feeding as well as independent mobility Fall risk was assessed by direct patient evaluation of patient's balance, gait and ability to risk from a chair and from a kneeling position. Home safety : The patient has smoke detectors in the home. They wear seatbelts.  There are no firearms at home. There is no violence in the home.  Patient has seen their eye doctor in the last year.   Visual acuity was assessed today  and was 20/20 with correction lenses. Patient denies hearing difficulty with regard to whispered voices and some television programs and has  deferred audiologic testing in the last year.   There is no risks for hepatitis, STDs or HIV. There is no   history of blood transfusion. They have no travel history to infectious disease endemic areas of the world.  The patient has seen their dentist in the last six month.  They do not  have excessive sun exposure. Discussed the need for sun protection: hats, long sleeves and use of sunscreen if there is significant sun exposure.   Diet: the importance of a healthy diet is discussed. They do have a healthy diet.  The benefits of regular aerobic exercise were discussed. Patient walks 4 times per week ,  a minimum of 20 minutes.   Depression screen:      11/08/2022    10:10 AM 09/29/2022    9:29 AM 02/28/2022    3:06 PM 09/19/2021    9:34 AM 06/17/2021    9:50 AM  Depression screen PHQ 2/9  Decreased Interest 0 0 0 0 0  Down, Depressed, Hopeless 0 0 0 0 0  PHQ - 2 Score 0 0 0 0 0       Cognitive assessment: the patient manages all their financial and personal affairs and is actively engaged. They could relate day,date,year and events; recalled 3/3 objects at 3 minutes; performed clock-face test normally.  The following portions of the patient's history were reviewed and updated as appropriate: allergies, current medications, past family history, past medical history,  past surgical history, past social history  and problem list.  During the course of the visit the patient was educated and counseled about appropriate screening and preventive services including : fall prevention , diabetes screening, nutrition counseling, colorectal cancer screening, and recommended immunizations   Immunization History  Administered Date(s) Administered   Influenza, High Dose Seasonal PF 02/24/2021, 03/14/2022   Influenza,inj,Quad PF,6+ Mos 02/17/2019   Influenza-Unspecified 03/17/2017, 03/08/2018, 04/09/2020   Moderna Covid-19 Vaccine Bivalent Booster 54yrs & up 04/30/2021   Moderna Sars-Covid-2 Vaccination 08/22/2019, 09/19/2019, 04/13/2020   PNEUMOCOCCAL CONJUGATE-20 09/29/2022   Tdap 02/25/2020   Zoster Recombinat (Shingrix) 12/07/2017, 04/12/2018   Health Maintenance Due  Topic Date Due   Medicare Annual Wellness (AWV)  Never done   COVID-19 Vaccine (5 - 2023-24 season) 02/17/2022    Last skin cancer screening :   sees [facility] annually,  h/o BCC  excised in office      Vital Signs: BP 124/80   Pulse 62   Temp 98.4 F (36.9 C) (Oral)   Ht 6' 2.5" (1.892 m)   Wt 182 lb 3.2 oz (82.6 kg)   SpO2 97%   BMI 23.08 kg/m    Exam: General appearance: alert, cooperative and appears stated age Head: Normocephalic, without obvious abnormality,  atraumatic Eyes: conjunctivae/corneas clear. PERRL, EOM's intact. Fundi benign. Ears: normal TM's and external ear canals both ears Nose: Nares normal. Septum midline. Mucosa normal. No drainage or sinus tenderness. Throat: lips, mucosa, and tongue normal; teeth and gums normal Neck: no adenopathy, no carotid bruit, no JVD, supple, symmetrical, trachea midline and thyroid not enlarged, symmetric, no tenderness/mass/nodules Lungs: clear to auscultation bilaterally Breasts: normal appearance, no masses or tenderness Heart: regular rate and rhythm, S1, S2 normal, no murmur, click, rub or gallop Abdomen: soft, non-tender; bowel sounds normal; no masses,  no organomegaly Extremities: extremities normal, atraumatic, no cyanosis or edema Pulses: 2+ and symmetric Skin: Skin color, texture, turgor normal. No rashes or lesions Neurologic: Alert and oriented X 3, normal strength and tone. Normal symmetric reflexes. Normal coordination and gait.     End of Life Discussion and Planning   During the course of the visit , End of Life objectives were discussed at length.  Patient does not have a living will in place or a healthcare power of attorney.  Patient  was given printed information about advance directives and encouraged to return after discussing with their family.  Review of Opioid Prescriptions    Patient does not have a current opioid prescription Patient's risk factors for opioid use disorder was reviewed and includes nothing  Treatment of pain using non-opioid alternatives was reviewed  and encouraged   Patient risk for potential substance abuse was assessed and addressed with counselling.

## 2022-11-08 NOTE — Assessment & Plan Note (Signed)
Taking 12.5 mg hctz.  Readings are all < 140/90 and mostly < 130/80.  No changes today 

## 2022-11-08 NOTE — Patient Instructions (Addendum)
Ok to use allegra up to 4 times daily to manage the  itching  Prednisone  and doxycycline  will cover  whatever bit you!   Suspend atorvastatin until you have stopped  taking the lamisil   We'll repeat the  iron studies in 3 months

## 2022-11-08 NOTE — Assessment & Plan Note (Signed)
Annual PSAs have been very low.   Lab Results  Component Value Date   PSA 0.33 09/29/2022   PSA 0.07 (L) 09/26/2021   PSA 0.14 09/24/2020

## 2022-11-15 DIAGNOSIS — L309 Dermatitis, unspecified: Secondary | ICD-10-CM | POA: Diagnosis not present

## 2022-11-15 DIAGNOSIS — L989 Disorder of the skin and subcutaneous tissue, unspecified: Secondary | ICD-10-CM | POA: Diagnosis not present

## 2022-12-07 DIAGNOSIS — L820 Inflamed seborrheic keratosis: Secondary | ICD-10-CM | POA: Diagnosis not present

## 2022-12-07 DIAGNOSIS — D2271 Melanocytic nevi of right lower limb, including hip: Secondary | ICD-10-CM | POA: Diagnosis not present

## 2022-12-07 DIAGNOSIS — D2262 Melanocytic nevi of left upper limb, including shoulder: Secondary | ICD-10-CM | POA: Diagnosis not present

## 2022-12-07 DIAGNOSIS — D2272 Melanocytic nevi of left lower limb, including hip: Secondary | ICD-10-CM | POA: Diagnosis not present

## 2022-12-07 DIAGNOSIS — L538 Other specified erythematous conditions: Secondary | ICD-10-CM | POA: Diagnosis not present

## 2022-12-07 DIAGNOSIS — L570 Actinic keratosis: Secondary | ICD-10-CM | POA: Diagnosis not present

## 2022-12-07 DIAGNOSIS — D485 Neoplasm of uncertain behavior of skin: Secondary | ICD-10-CM | POA: Diagnosis not present

## 2022-12-07 DIAGNOSIS — D2261 Melanocytic nevi of right upper limb, including shoulder: Secondary | ICD-10-CM | POA: Diagnosis not present

## 2022-12-07 DIAGNOSIS — C44519 Basal cell carcinoma of skin of other part of trunk: Secondary | ICD-10-CM | POA: Diagnosis not present

## 2022-12-13 ENCOUNTER — Ambulatory Visit: Payer: Medicare PPO | Admitting: Student in an Organized Health Care Education/Training Program

## 2022-12-13 ENCOUNTER — Encounter: Payer: Self-pay | Admitting: Student in an Organized Health Care Education/Training Program

## 2022-12-13 VITALS — BP 118/64 | HR 67 | Temp 97.9°F | Ht 74.5 in | Wt 184.6 lb

## 2022-12-13 DIAGNOSIS — R911 Solitary pulmonary nodule: Secondary | ICD-10-CM

## 2022-12-13 NOTE — Progress Notes (Signed)
Synopsis: Referred in for pulmonary nodule by Sherlene Shams, MD  Assessment & Plan:   1. Solitary pulmonary nodule  Nodule Location: RUL Nodule Size: 4 mm Nodule Spiculation: No Associated Lymphadenopathy: No Smoking Status (former) and pack years 2-5 Extrathoracic cancer > 5 years prior (no) SPN malignancy risk score [facility]): 5 %risk of malignancy ECOG: 0  The patient is here to discuss their imaging abnormalities which include a 4 mm nodule in the RUL. Discussed with the patient that the nodule is most likely benign but given his smoking history, his risk is not zero. The differential includes infectious (including endemic fungi), inflammatory, and least likely malignancy. Will obtain a repeat chest CT for radiographic monitoring of the nodule. Should the repeat scan show resolution or no change in the size of the nodule, will cease further monitoring of the nodule.   Recommendations: CT CHEST WO CONTRAST; Future   Return if symptoms worsen or fail to improve.  I spent 45 minutes caring for this patient today, including preparing to see the patient, obtaining a medical history , reviewing a separately obtained history, performing a medically appropriate examination and/or evaluation, counseling and educating the patient/family/caregiver, ordering medications, tests, or procedures, and documenting clinical information in the electronic health record  Raechel Chute, MD [facility] 12/13/2022 1:43 PM    End of visit medications:  No orders of the defined types were placed in this encounter.    Current Outpatient Medications:    ALPRAZolam (XANAX) 0.25 MG tablet, TAKE 0.5 TABLET BY MOUTH DAILY AS NEEDEDFOR ANXIETY OR SLEEP., Disp: 20 tablet, Rfl: 5   atorvastatin (LIPITOR) 20 MG tablet, Take 1 tablet (20 mg total) by mouth daily., Disp: 90 tablet, Rfl: 3   hydrochlorothiazide (MICROZIDE) 12.5 MG capsule, Take 1 capsule (12.5 mg total) by  mouth daily., Disp: 90 capsule, Rfl: 1   minoxidil (LONITEN) 2.5 MG tablet, Take 0.5 tablets (1.25 mg total) by mouth daily., Disp: 30 tablet, Rfl: 3   terbinafine (LAMISIL) 250 MG tablet, Take 1 tablet (250 mg total) by mouth daily., Disp: 90 tablet, Rfl: 0   triamcinolone (KENALOG) 0.1 %, Apply topically 2 (two) times daily., Disp: , Rfl:    Subjective:   PATIENT ID: Bobby Mills GENDER: male DOB: [birth year], MRN: [id number]  Chief Complaint  Patient presents with   pulmonary consult    CT 12/2021- no current sx.     HPI  Mr. Bobby Mills is a pleasant 67 year old male patient presenting to clinic for the evaluation of a single pulmonary nodule.  Patient is in his usual state of health and has no symptoms.  He had CT cardiac scoring last year in July which was noted to have a 4 millimeter pulmonary nodule for which 1 year follow-up was recommended.  He is presenting today to discuss this nodule.  He has no shortness of breath, no cough, no fevers, no chills, no chest tightness, no weight loss, no night sweats.  He was originally from [place] but lived throughout the [place] for most of his life and moved back here 5 years ago.  He did spend time in [place], [place], in [place].  He has a distant past medical history of smoking with between 2.5 pack years of smoking history.  He quit in 2009.  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  Ancillary information including prior medications, full medical/surgical/family/social histories, and PFTs (when available) are listed below and have  been reviewed.   Review of Systems  Constitutional:  Negative for chills, fever, malaise/fatigue and weight loss.  Respiratory:  Negative for cough, hemoptysis, sputum production, shortness of breath and wheezing.   Cardiovascular:  Negative for chest pain and PND.     Objective:   Vitals:   12/13/22 1326  BP: 118/64  Pulse: 67  Temp: 97.9 F (36.6 C)   TempSrc: Temporal  SpO2: 96%  Weight: 184 lb 9.6 oz (83.7 kg)  Height: 6' 2.5" (1.892 m)   96% on RA BMI Readings from Last 3 Encounters:  12/13/22 23.38 kg/m  11/08/22 23.08 kg/m  09/29/22 23.23 kg/m   Wt Readings from Last 3 Encounters:  12/13/22 184 lb 9.6 oz (83.7 kg)  11/08/22 182 lb 3.2 oz (82.6 kg)  09/29/22 183 lb 6.4 oz (83.2 kg)    Physical Exam Constitutional:      General: He is not in acute distress.    Appearance: Normal appearance. He is not ill-appearing.  HENT:     Head: Normocephalic.     Mouth/Throat:     Mouth: Mucous membranes are moist.  Cardiovascular:     Rate and Rhythm: Normal rate and regular rhythm.     Heart sounds: Normal heart sounds.  Pulmonary:     Effort: Pulmonary effort is normal.     Breath sounds: Normal breath sounds.  Neurological:     General: No focal deficit present.     Mental Status: He is alert.       Ancillary Information    Past Medical History:  Diagnosis Date   Allergy    seasonal   History of colon polyps      Family History  Problem Relation Age of Onset   Hearing loss Mother    Hypertension Mother    Hearing loss Father    Hyperlipidemia Father    Heart disease Father 8       CAD, 7 vessel bypass at age 63    Cancer Father    Heart failure Paternal Grandmother    Heart attack Paternal Grandfather      No past surgical history on file.  Social History   Socioeconomic History   Marital status: Single    Spouse name: Not on file   Number of children: Not on file   Years of education: Not on file   Highest education level: Not on file  Occupational History   Occupation: Quarry manager  Tobacco Use   Smoking status: Former    Packs/day: 0.10    Years: 25.00    Additional pack years: 0.00    Total pack years: 2.50    Types: Cigarettes    Quit date: 2009    Years since quitting: 15.4    Passive exposure: Past   Smokeless tobacco: Never  Substance and Sexual Activity   Alcohol  use: No   Drug use: No   Sexual activity: Never  Other Topics Concern   Not on file  Social History Narrative   Patient has retired from his [place] job and  relocated to [place] from [place],  To assist his parents in their old age.  He has since moved in with his mother since his father passed.    Social Determinants of Health   Financial Resource Strain: Not on file  Food Insecurity: Not on file  Transportation Needs: Not on file  Physical Activity: Inactive (06/24/2017)   Exercise Vital Sign    Days of Exercise per Week: 0  days    Minutes of Exercise per Session: 0 min  Stress: No Stress Concern Present (06/24/2017)   [employer] of Occupational Health - Occupational Stress Questionnaire    Feeling of Stress : Only a little  Social Connections: Moderately Integrated (06/24/2017)   Social Connection and Isolation Panel [NHANES]    Frequency of Communication with Friends and Family: More than three times a week    Frequency of Social Gatherings with Friends and Family: More than three times a week    Attends Religious Services: More than 4 times per year    Active Member of Clubs or Organizations: Yes    Attends Banker Meetings: 1 to 4 times per year    Marital Status: Never married  Intimate Partner Violence: Not At Risk (06/24/2017)   Humiliation, Afraid, Rape, and Kick questionnaire    Fear of Current or Ex-Partner: No    Emotionally Abused: No    Physically Abused: No    Sexually Abused: No     No Known Allergies   CBC    Component Value Date/Time   WBC 4.4 09/29/2022 1015   RBC 5.18 09/29/2022 1015   HGB 16.9 09/29/2022 1015   HCT 49.2 09/29/2022 1015   PLT 224.0 09/29/2022 1015   MCV 94.8 09/29/2022 1015   MCHC 34.3 09/29/2022 1015   RDW 13.2 09/29/2022 1015   LYMPHSABS 1.3 09/29/2022 1015   MONOABS 0.5 09/29/2022 1015   EOSABS 0.1 09/29/2022 1015   BASOSABS 0.1 09/29/2022 1015    Pulmonary Functions Testing Results:     No data to  display          Outpatient Medications Prior to Visit  Medication Sig Dispense Refill   ALPRAZolam (XANAX) 0.25 MG tablet TAKE 0.5 TABLET BY MOUTH DAILY AS NEEDEDFOR ANXIETY OR SLEEP. 20 tablet 5   atorvastatin (LIPITOR) 20 MG tablet Take 1 tablet (20 mg total) by mouth daily. 90 tablet 3   hydrochlorothiazide (MICROZIDE) 12.5 MG capsule Take 1 capsule (12.5 mg total) by mouth daily. 90 capsule 1   minoxidil (LONITEN) 2.5 MG tablet Take 0.5 tablets (1.25 mg total) by mouth daily. 30 tablet 3   terbinafine (LAMISIL) 250 MG tablet Take 1 tablet (250 mg total) by mouth daily. 90 tablet 0   triamcinolone (KENALOG) 0.1 % Apply topically 2 (two) times daily.     doxycycline (VIBRA-TABS) 100 MG tablet Take 1 tablet (100 mg total) by mouth 2 (two) times daily. 14 tablet 0   predniSONE (DELTASONE) 10 MG tablet 6 tablets on Day 1 , then reduce by 1 tablet daily until gone 21 tablet 0   No facility-administered medications prior to visit.

## 2023-01-18 ENCOUNTER — Ambulatory Visit
Admission: RE | Admit: 2023-01-18 | Discharge: 2023-01-18 | Disposition: A | Discharge status: Home / Self Care | Payer: Medicare PPO | Source: Ambulatory Visit | Attending: Student in an Organized Health Care Education/Training Program | Admitting: Student in an Organized Health Care Education/Training Program

## 2023-01-18 DIAGNOSIS — R911 Solitary pulmonary nodule: Secondary | ICD-10-CM | POA: Diagnosis not present

## 2023-01-18 DIAGNOSIS — R918 Other nonspecific abnormal finding of lung field: Secondary | ICD-10-CM | POA: Diagnosis not present

## 2023-02-01 NOTE — Progress Notes (Signed)
Patient repeat CT showed resolution of a nodule but identified another nodule. He will need a repeat CT in 1 year. Could we please schedule him to be seen by me in 1 year (June of 2025) so we can order the CT. I already left a mychart message for the patient with the results. Thanks!

## 2023-02-06 DIAGNOSIS — C44519 Basal cell carcinoma of skin of other part of trunk: Secondary | ICD-10-CM | POA: Diagnosis not present

## 2023-02-06 DIAGNOSIS — D235 Other benign neoplasm of skin of trunk: Secondary | ICD-10-CM | POA: Diagnosis not present

## 2023-02-06 DIAGNOSIS — L570 Actinic keratosis: Secondary | ICD-10-CM | POA: Diagnosis not present

## 2023-02-09 ENCOUNTER — Other Ambulatory Visit (INDEPENDENT_AMBULATORY_CARE_PROVIDER_SITE_OTHER): Payer: Medicare PPO

## 2023-02-09 DIAGNOSIS — E785 Hyperlipidemia, unspecified: Secondary | ICD-10-CM | POA: Diagnosis not present

## 2023-02-09 DIAGNOSIS — R790 Abnormal level of blood mineral: Secondary | ICD-10-CM | POA: Diagnosis not present

## 2023-02-09 LAB — COMPREHENSIVE METABOLIC PANEL
ALT: 11 U/L (ref 0–53)
AST: 18 U/L (ref 0–37)
Albumin: 4.2 g/dL (ref 3.5–5.2)
Alkaline Phosphatase: 67 U/L (ref 39–117)
BUN: 17 mg/dL (ref 6–23)
CO2: 32 mEq/L (ref 19–32)
Calcium: 9.5 mg/dL (ref 8.4–10.5)
Chloride: 102 mEq/L (ref 96–112)
Creatinine, Ser: 0.97 mg/dL (ref 0.40–1.50)
GFR: 81.53 mL/min (ref >60.00)
Glucose, Bld: 79 mg/dL (ref 70–99)
Potassium: 3.4 mEq/L — ABNORMAL LOW (ref 3.5–5.1)
Sodium: 141 mEq/L (ref 135–145)
Total Bilirubin: 0.8 mg/dL (ref 0.2–1.2)
Total Protein: 6.7 g/dL (ref 6.0–8.3)

## 2023-02-10 LAB — IRON,TIBC AND FERRITIN PANEL
%SAT: 44 % (ref 20–48)
Ferritin: 138 ng/mL (ref 24–380)
Iron: 109 ug/dL (ref 50–180)
TIBC: 246 ug/dL — ABNORMAL LOW (ref 250–425)

## 2023-02-13 ENCOUNTER — Other Ambulatory Visit: Payer: Self-pay | Admitting: Internal Medicine

## 2023-03-07 ENCOUNTER — Ambulatory Visit: Payer: Medicare PPO | Admitting: Podiatry

## 2023-04-04 ENCOUNTER — Ambulatory Visit: Payer: Medicare PPO | Admitting: Internal Medicine

## 2023-04-05 ENCOUNTER — Ambulatory Visit: Payer: Medicare PPO | Admitting: Urology

## 2023-04-05 ENCOUNTER — Encounter: Payer: Self-pay | Admitting: Urology

## 2023-04-05 VITALS — BP 131/83 | HR 67 | Ht 74.0 in | Wt 175.0 lb

## 2023-04-05 DIAGNOSIS — N200 Calculus of kidney: Secondary | ICD-10-CM

## 2023-04-05 DIAGNOSIS — R351 Nocturia: Secondary | ICD-10-CM

## 2023-04-05 DIAGNOSIS — Z125 Encounter for screening for malignant neoplasm of prostate: Secondary | ICD-10-CM

## 2023-04-05 NOTE — Progress Notes (Signed)
04/05/2023 10:17 AM   Nunzio Cobbs [birth year] [id number]  Reason for visit: Follow up urinary symptoms, nocturia, nephrolithiasis, PSA screening  HPI: Healthy 67 year old male who I originally saw in August 2023 for routine PSA screening, urinary symptoms, nocturia up to 4 times per night.  Urinalysis was benign, PVR was normal, DRE showed 20 g smooth normal prostate.  He opted for trial of Flomax, but actually never ended up taking that medication.  He worked on behavioral strategies of minimizing fluids prior to bed and double voiding and symptoms improved somewhat.  He currently has nocturia 0-3 times overnight that is not particularly bothersome.  Most recent PSA from April 2024 remains normal at 0.33.  Previously was taking finasteride for hair loss, so increase from 0.15 baseline is expected.  Reassurance provided, can continue screening every 2 years per guideline recommendations, reasonable to continue screening through age 21 with his good health.  He recently had a CT chest in August 2024 which incidentally showed a 7 mm nonobstructing left upper pole stone.  I personally viewed and interpreted those images, and agree with those findings.  We discussed options including shockwave lithotripsy, ureteroscopy, or surveillance.  He opted for surveillance.  He has never had any kidney stone issues. We discussed general stone prevention strategies including adequate hydration with goal of producing 2.5 L of urine daily, increasing citric acid intake, increasing calcium intake during high oxalate meals, minimizing animal protein, and decreasing salt intake. Information about dietary recommendations given today.   RTC 1 year PVR, will review repeat CT chest for surveillance of left upper pole stone   Sondra Come, MD  [facility] [address], [address] [place], [place] [address] [phone]

## 2023-04-05 NOTE — Patient Instructions (Addendum)
You can try a sleep aid like Unisom or melatonin, this can often help people sleep through the night better and decrease overnight urination   Nocturia refers to the need to wake up during the night to urinate, which can disrupt your sleep and impact your overall well-being. Fortunately, there are several strategies you can employ to help prevent or manage nocturia. It's important to consult with your healthcare provider before making any significant changes to your routine. Here are some helpful strategies to consider:  Limit Fluid Intake Before Bed: Avoid drinking large amounts of fluids in the evening, especially within a few hours of bedtime. Consume most of your daily fluid intake earlier in the day to reduce the need to urinate at night.  Monitor Your Diet: Limit your intake of caffeine and alcohol, as these substances can increase urine production and irritate the bladder.  Avoid diet, "zero calorie," and artificially sweetened drinks, especially sodas, in the afternoon or evening. Be mindful of consuming foods and drinks with high water content before bedtime, such as watermelon and herbal teas.  Time Your Medications: If you're taking medications that contribute to increased urination, consult your healthcare provider about adjusting the timing of these medications to minimize their impact during the night.  Practice Double Voiding: Before going to bed, make an effort to empty your bladder twice within a short period. This can help reduce the amount of urine left in your bladder before sleep.  Bladder Training: Gradually increase the time between bathroom visits during the day to train your bladder to hold larger volumes of urine. Over time, this can help reduce the frequency of nighttime awakenings to urinate.  Elevate Your Legs During the Day: Elevating your legs during the day can help minimize fluid retention in your lower extremities, which might reduce nighttime  urination.  Pelvic Floor Exercises: Strengthening your pelvic floor muscles through Kegel exercises can help improve bladder control and potentially reduce the urge to urinate at night.  Create a Relaxing Bedtime Routine: Stress and anxiety can exacerbate nocturia. Engage in calming activities before bed, such as reading, listening to soothing music, or practicing relaxation techniques.  Stay Active: Engage in regular physical activity, but avoid intense exercise close to bedtime, as this can increase your body's demand for fluids.  Maintain a Healthy Weight: Excess weight can compress the bladder and contribute to bladder and urinary issues. Aim to achieve and maintain a healthy weight through a balanced diet and regular exercise.  Remember that every individual is unique, and the effectiveness of these strategies may vary. It's important to work with your healthcare provider to develop a plan that suits your specific needs and addresses any underlying causes of nocturia.

## 2023-04-16 DIAGNOSIS — B880 Other acariasis: Secondary | ICD-10-CM | POA: Diagnosis not present

## 2023-04-16 DIAGNOSIS — H01009 Unspecified blepharitis unspecified eye, unspecified eyelid: Secondary | ICD-10-CM | POA: Diagnosis not present

## 2023-04-17 ENCOUNTER — Ambulatory Visit: Payer: Medicare PPO | Admitting: Internal Medicine

## 2023-04-17 VITALS — BP 132/88 | HR 64 | Ht 74.0 in | Wt 177.4 lb

## 2023-04-17 DIAGNOSIS — G4701 Insomnia due to medical condition: Secondary | ICD-10-CM | POA: Diagnosis not present

## 2023-04-17 DIAGNOSIS — I10 Essential (primary) hypertension: Secondary | ICD-10-CM | POA: Diagnosis not present

## 2023-04-17 DIAGNOSIS — R918 Other nonspecific abnormal finding of lung field: Secondary | ICD-10-CM

## 2023-04-17 DIAGNOSIS — N200 Calculus of kidney: Secondary | ICD-10-CM | POA: Diagnosis not present

## 2023-04-17 DIAGNOSIS — B351 Tinea unguium: Secondary | ICD-10-CM

## 2023-04-17 DIAGNOSIS — E785 Hyperlipidemia, unspecified: Secondary | ICD-10-CM | POA: Diagnosis not present

## 2023-04-17 DIAGNOSIS — G2581 Restless legs syndrome: Secondary | ICD-10-CM | POA: Diagnosis not present

## 2023-04-17 MED ORDER — ROPINIROLE HCL 0.25 MG PO TABS: 0.2500 mg | ORAL_TABLET | Freq: Three times a day (TID) | ORAL | 1 refills | Status: DC

## 2023-04-17 NOTE — Assessment & Plan Note (Signed)
starting ropinirole at 0.25 mg with first dose after dinner

## 2023-04-17 NOTE — Assessment & Plan Note (Signed)
He suspended atorvastatin during lamisil trial but stopped lamisil due to lack of effectiveness and has resumed atorvastatin

## 2023-04-17 NOTE — Patient Instructions (Addendum)
I am prescribing the lowest dose of Requip :  Take one tablet after dinner,   and another at bedtime for restless legs

## 2023-04-17 NOTE — Progress Notes (Signed)
Subjective:  Patient ID: Bobby Mills, male    DOB: 11/27/1955  Age: 67 y.o. MRN: [id number]  CC: The primary encounter diagnosis was Essential hypertension. Diagnoses of Hyperlipidemia LDL goal <130, Multiple pulmonary nodules determined by computed tomography of lung, Nephrolithiasis, Insomnia due to restless legs syndrome, and Onychomycosis were also pertinent to this visit.   HPI Bobby Mills presents for  Chief Complaint  Patient presents with   Medical Management of Chronic Issues    6 month follow up     Anxiety:  she has  been caring for mother who fell In mid August  in the bathroom and sustained a femur fracture at age 71.   She is Walking with a walker  seeing Dr Audelia Acton,    HTN:  checks at home ,  one episode of weakness lasting several days,  hypotensive,  stopped hct and symptom  improved   Readings 110 to 117,  night 129 to 134  .  Has been taking minoxidil  For hair loss   Pulmonary nodules noted on recent CT.  All subcentimeter.  CT findings reviewed with patient  Kidney stone on left side , upper pole asymptomatic  RLS at night . Iron studies normal.  Symptoms start several hours before bedtime   Outpatient Medications Prior to Visit  Medication Sig Dispense Refill   ALPRAZolam (XANAX) 0.25 MG tablet TAKE 0.5 TABLET BY MOUTH DAILY AS NEEDEDFOR ANXIETY OR SLEEP. 20 tablet 5   atorvastatin (LIPITOR) 20 MG tablet TAKE 1 TABLET BY MOUTH DAILY 90 tablet 3   minoxidil (LONITEN) 2.5 MG tablet Take 0.5 tablets (1.25 mg total) by mouth daily. 30 tablet 3   triamcinolone (KENALOG) 0.1 % Apply topically 2 (two) times daily.     XDEMVY 0.25 % SOLN Apply 1 drop to eye 2 (two) times daily.     hydrochlorothiazide (MICROZIDE) 12.5 MG capsule Take 1 capsule (12.5 mg total) by mouth daily. 90 capsule 1   terbinafine (LAMISIL) 250 MG tablet Take 1 tablet (250 mg total) by mouth daily. (Patient not taking: Reported on 04/17/2023) 90 tablet 0   No facility-administered  medications prior to visit.    Review of Systems;  Patient denies headache, fevers, malaise, unintentional weight loss, skin rash, eye pain, sinus congestion and sinus pain, sore throat, dysphagia,  hemoptysis , cough, dyspnea, wheezing, chest pain, palpitations, orthopnea, edema, abdominal pain, nausea, melena, diarrhea, constipation, flank pain, dysuria, hematuria, urinary  Frequency, nocturia, numbness, tingling, seizures,  Focal weakness, Loss of consciousness,  Tremor, insomnia, depression, anxiety, and suicidal ideation.      Objective:  BP 132/88   Pulse 64   Ht 6\' 2"  (1.88 m)   Wt 177 lb 6.4 oz (80.5 kg)   SpO2 98%   BMI 22.78 kg/m   BP Readings from Last 3 Encounters:  04/17/23 132/88  04/05/23 131/83  12/13/22 118/64    Wt Readings from Last 3 Encounters:  04/17/23 177 lb 6.4 oz (80.5 kg)  04/05/23 175 lb (79.4 kg)  12/13/22 184 lb 9.6 oz (83.7 kg)    Physical Exam Vitals reviewed.  Constitutional:      General: He is not in acute distress.    Appearance: Normal appearance. He is normal weight. He is not ill-appearing, toxic-appearing or diaphoretic.  HENT:     Head: Normocephalic.  Eyes:     General: No scleral icterus.       Right eye: No discharge.        Left eye:  No discharge.     Conjunctiva/sclera: Conjunctivae normal.  Cardiovascular:     Rate and Rhythm: Normal rate and regular rhythm.     Heart sounds: Normal heart sounds.  Pulmonary:     Effort: Pulmonary effort is normal. No respiratory distress.     Breath sounds: Normal breath sounds.  Musculoskeletal:        General: Normal range of motion.     Cervical back: Normal range of motion.  Skin:    General: Skin is warm and dry.  Neurological:     General: No focal deficit present.     Mental Status: He is alert and oriented to person, place, and time. Mental status is at baseline.  Psychiatric:        Mood and Affect: Mood normal.        Behavior: Behavior normal.        Thought Content:  Thought content normal.        Judgment: Judgment normal.    Lab Results  Component Value Date   HGBA1C 5.3 09/29/2022    Lab Results  Component Value Date   CREATININE 0.97 02/09/2023   CREATININE 0.94 11/01/2022   CREATININE 0.93 09/29/2022    Lab Results  Component Value Date   WBC 4.4 09/29/2022   HGB 16.9 09/29/2022   HCT 49.2 09/29/2022   PLT 224.0 09/29/2022   GLUCOSE 79 02/09/2023   CHOL 127 09/29/2022   TRIG 79.0 09/29/2022   HDL 49.00 09/29/2022   LDLDIRECT 68.0 09/29/2022   LDLCALC 62 09/29/2022   ALT 11 02/09/2023   AST 18 02/09/2023   NA 141 02/09/2023   K 3.4 (L) 02/09/2023   CL 102 02/09/2023   CREATININE 0.97 02/09/2023   BUN 17 02/09/2023   CO2 32 02/09/2023   TSH 1.56 09/29/2022   PSA 0.33 09/29/2022   HGBA1C 5.3 09/29/2022   MICROALBUR 1.0 06/23/2021    CT CHEST WO CONTRAST  Result Date: 01/24/2023 CLINICAL DATA:  Pulmonary nodule. EXAM: CT CHEST WITHOUT CONTRAST TECHNIQUE: Multidetector CT imaging of the chest was performed following the standard protocol without IV contrast. RADIATION DOSE REDUCTION: This exam was performed according to the departmental dose-optimization program which includes automated exposure control, adjustment of the mA and/or kV according to patient size and/or use of iterative reconstruction technique. COMPARISON:  01/16/2022 FINDINGS: Cardiovascular: The heart size is normal. No substantial pericardial effusion. Coronary artery calcification is evident. No thoracic aortic aneurysm. No substantial atherosclerosis of the thoracic aorta. Mediastinum/Nodes: No mediastinal lymphadenopathy. No evidence for gross hilar lymphadenopathy although assessment is limited by the lack of intravenous contrast on the current study. The esophagus has normal imaging features. There is no axillary lymphadenopathy. Lungs/Pleura: 3 mm right upper lobe nodule on 51/3 was not included in the field of view on the previous cardiac CT. 3 mm posterior  right upper lobe nodule on 74/3 was also outside the field of view previously. 4 mm right upper lobe pulmonary nodule identified previously has resolved in the interval. 2 mm right lower lobe nodule on 144/3 is new in the interval. 4 mm left upper lobe nodule identified on 36/3. No overtly suspicious pulmonary nodule or mass. No focal airspace consolidation. No pleural effusion. Upper Abdomen: 2.7 cm cyst identified in the anterior right liver. No followup imaging is recommended. Scattered tiny hypodensities in the liver parenchyma are too small to characterize but are statistically most likely benign. No followup imaging is recommended. 7 mm nonobstructing stone identified upper pole left  kidney. Probable central sinus cysts in the left kidney. No followup imaging is recommended. Musculoskeletal: No worrisome lytic or sclerotic osseous abnormality. IMPRESSION: 1. Interval resolution of the 4 mm right upper lobe pulmonary nodule identified previously. 2. Additional bilateral pulmonary nodules measuring up to 4 mm that are either new or were not included in the field of view on the prior study. No follow-up needed if patient is low-risk (and has no known or suspected primary neoplasm). Non-contrast chest CT can be considered in 12 months if patient is high-risk. This recommendation follows the consensus statement: Guidelines for Management of Incidental Pulmonary Nodules Detected on CT Images: From the Fleischner Society 2017; Radiology 2017; 284:228-243. 3. 7 mm nonobstructing stone upper pole left kidney. Electronically Signed   By: Kennith Center M.D.   On: 01/24/2023 09:49    Assessment & Plan:  .Essential hypertension Assessment & Plan: He has stopped hydrochlorothiazide due to hypotension.  Repeat readings have been normal   Orders: -     Comprehensive metabolic panel; Future -     Microalbumin / creatinine urine ratio; Future  Hyperlipidemia LDL goal <130 Assessment & Plan: Tolerating atorvastatin   But at lower dose than recommended by cardiology.   LFTs are normal.   Lab Results  Component Value Date   CHOL 127 09/29/2022   HDL 49.00 09/29/2022   LDLCALC 62 09/29/2022   LDLDIRECT 68.0 09/29/2022   TRIG 79.0 09/29/2022   CHOLHDL 3 09/29/2022    Lab Results  Component Value Date   ALT 11 02/09/2023   AST 18 02/09/2023   ALKPHOS 67 02/09/2023   BILITOT 0.8 02/09/2023      Orders: -     Lipid panel; Future -     LDL cholesterol, direct; Future  Multiple pulmonary nodules determined by computed tomography of lung Assessment & Plan: Seen on August CT chest.. all subcentimeter.  He is a lifelong   Non smoker.  Continue follow up with pulmonary    Nephrolithiasis  Insomnia due to restless legs syndrome Assessment & Plan: starting ropinirole at 0.25 mg with first dose after dinner    Onychomycosis Assessment & Plan: He suspended atorvastatin during lamisil trial but stopped lamisil due to lack of effectiveness and has resumed atorvastatin    Other orders -     rOPINIRole HCl; Take 1 tablet (0.25 mg total) by mouth 3 (three) times daily.  Dispense: 90 tablet; Refill: 1     I provided  34 minutes of face-to-face time during this encounter reviewing patient's last visit with me, patient's  most recent visit with pulmonology, recent surgical and non surgical procedures, previous  labs and imaging studies, counseling on currently addressed issues,  and post visit ordering to diagnostics and therapeutics .   Follow-up: Return in about 6 months (around 10/16/2023).   Sherlene Shams, MD

## 2023-04-17 NOTE — Assessment & Plan Note (Addendum)
He has stopped hydrochlorothiazide due to hypotension.  Repeat readings have been normal

## 2023-04-17 NOTE — Assessment & Plan Note (Signed)
Seen on August CT chest.. all subcentimeter.  He is a lifelong   Non smoker.  Continue follow up with pulmonary

## 2023-04-17 NOTE — Assessment & Plan Note (Addendum)
Tolerating atorvastatin  But at lower dose than recommended by cardiology.   LFTs are normal.   Lab Results  Component Value Date   CHOL 127 09/29/2022   HDL 49.00 09/29/2022   LDLCALC 62 09/29/2022   LDLDIRECT 68.0 09/29/2022   TRIG 79.0 09/29/2022   CHOLHDL 3 09/29/2022    Lab Results  Component Value Date   ALT 11 02/09/2023   AST 18 02/09/2023   ALKPHOS 67 02/09/2023   BILITOT 0.8 02/09/2023

## 2023-04-23 ENCOUNTER — Other Ambulatory Visit: Payer: Self-pay | Admitting: Internal Medicine

## 2023-07-11 ENCOUNTER — Encounter: Payer: Self-pay | Admitting: Internal Medicine

## 2023-07-11 ENCOUNTER — Ambulatory Visit (INDEPENDENT_AMBULATORY_CARE_PROVIDER_SITE_OTHER): Payer: PPO | Admitting: Internal Medicine

## 2023-07-11 VITALS — BP 126/86 | HR 57 | Ht 74.0 in | Wt 182.6 lb

## 2023-07-11 DIAGNOSIS — I10 Essential (primary) hypertension: Secondary | ICD-10-CM | POA: Diagnosis not present

## 2023-07-11 DIAGNOSIS — G4701 Insomnia due to medical condition: Secondary | ICD-10-CM

## 2023-07-11 DIAGNOSIS — G2581 Restless legs syndrome: Secondary | ICD-10-CM | POA: Diagnosis not present

## 2023-07-11 DIAGNOSIS — E559 Vitamin D deficiency, unspecified: Secondary | ICD-10-CM | POA: Diagnosis not present

## 2023-07-11 DIAGNOSIS — L659 Nonscarring hair loss, unspecified: Secondary | ICD-10-CM | POA: Diagnosis not present

## 2023-07-11 LAB — VITAMIN D 25 HYDROXY (VIT D DEFICIENCY, FRACTURES): VITD: 26.73 ng/mL — ABNORMAL LOW (ref 30.00–100.00)

## 2023-07-11 LAB — BASIC METABOLIC PANEL
BUN: 16 mg/dL (ref 6–23)
CO2: 31 meq/L (ref 19–32)
Calcium: 9.6 mg/dL (ref 8.4–10.5)
Chloride: 101 meq/L (ref 96–112)
Creatinine, Ser: 0.91 mg/dL (ref 0.40–1.50)
GFR: 87.77 mL/min (ref >60.00)
Glucose, Bld: 87 mg/dL (ref 70–99)
Potassium: 4 meq/L (ref 3.5–5.1)
Sodium: 138 meq/L (ref 135–145)

## 2023-07-11 LAB — MAGNESIUM: Magnesium: 2.3 mg/dL (ref 1.5–2.5)

## 2023-07-11 LAB — MICROALBUMIN / CREATININE URINE RATIO
Creatinine,U: 81.3 mg/dL
Microalb Creat Ratio: 8.5 mg/g (ref 0.0–30.0)
Microalb, Ur: 6.9 mg/dL — ABNORMAL HIGH (ref 0.0–1.9)

## 2023-07-11 NOTE — Patient Instructions (Addendum)
Non pharmacologic therapies for restless legs:  GABA  (OTC supplement)  Weighted heating pad  EXERCISE   If you start the requip,  try just a dinnertime dose   BP meds will be modified once I see the labs from today

## 2023-07-11 NOTE — Assessment & Plan Note (Signed)
Taking oral minoxidil and finasteride (prescribed by Dr Evorn Gong)

## 2023-07-11 NOTE — Assessment & Plan Note (Addendum)
He resumed hydrochlorothiazide 12.5 mg n mid November and readings remain persistently  > 80 diastolic , with systolic readings ranging from 119 to 135.  Changing med to telmisartan 20 mg  given new onset microalbuminuria  Lab Results  Component Value Date   LABMICR See below: 02/16/2022   MICROALBUR 6.9 (H) 07/11/2023   MICROALBUR 1.0 06/23/2021

## 2023-07-11 NOTE — Assessment & Plan Note (Signed)
He has deferred start of Requip.   Encouraged to try heat with compression, ,  GABA  , B6 and Magnesium first

## 2023-07-11 NOTE — Progress Notes (Signed)
Subjective:  Patient ID: Bobby Mills, male    DOB: 13-Nov-1955  Age: 68 y.o. MRN: [id number]  CC: The primary encounter diagnosis was Essential hypertension. Diagnoses of Vitamin D deficiency, Alopecia, and Insomnia due to restless legs syndrome were also pertinent to this visit.   HPI Tecumseh Javadi presents for  Chief Complaint  Patient presents with   Medical Management of Chronic Issues    Follow up on blood pressure    1) h/o HTN:  hydrochlorothiazide stopped in October  due to hypotension and concurrent use of minoxidil for hair loss.  He resumed it several months ago and continues to note diastolics > 80 .   Systolics are dropping to 119 however,      2) RLS:  requip prescribed but not started    Outpatient Medications Prior to Visit  Medication Sig Dispense Refill   ALPRAZolam (XANAX) 0.25 MG tablet TAKE 1/2 TABLET BY MOUTH DAILY AS NEEDEDFOR ANXIETY OR SLEEP 20 tablet 5   atorvastatin (LIPITOR) 20 MG tablet TAKE 1 TABLET BY MOUTH DAILY 90 tablet 3   minoxidil (LONITEN) 2.5 MG tablet Take 0.5 tablets (1.25 mg total) by mouth daily. 30 tablet 3   rOPINIRole (REQUIP) 0.25 MG tablet Take 1 tablet (0.25 mg total) by mouth 3 (three) times daily. 90 tablet 1   triamcinolone (KENALOG) 0.1 % Apply topically 2 (two) times daily.     XDEMVY 0.25 % SOLN Apply 1 drop to eye 2 (two) times daily.     No facility-administered medications prior to visit.    Review of Systems;  Patient denies headache, fevers, malaise, unintentional weight loss, skin rash, eye pain, sinus congestion and sinus pain, sore throat, dysphagia,  hemoptysis , cough, dyspnea, wheezing, chest pain, palpitations, orthopnea, edema, abdominal pain, nausea, melena, diarrhea, constipation, flank pain, dysuria, hematuria, urinary  Frequency, nocturia, numbness, tingling, seizures,  Focal weakness, Loss of consciousness,  Tremor, insomnia, depression, anxiety, and suicidal ideation.      Objective:  BP 126/86    Pulse (!) 57   Ht 6\' 2"  (1.88 m)   Wt 182 lb 9.6 oz (82.8 kg)   SpO2 99%   BMI 23.44 kg/m   BP Readings from Last 3 Encounters:  07/11/23 126/86  04/17/23 132/88  04/05/23 131/83    Wt Readings from Last 3 Encounters:  07/11/23 182 lb 9.6 oz (82.8 kg)  04/17/23 177 lb 6.4 oz (80.5 kg)  04/05/23 175 lb (79.4 kg)    Physical Exam Vitals reviewed.  Constitutional:      General: He is not in acute distress.    Appearance: Normal appearance. He is normal weight. He is not ill-appearing, toxic-appearing or diaphoretic.  HENT:     Head: Normocephalic.  Eyes:     General: No scleral icterus.       Right eye: No discharge.        Left eye: No discharge.     Conjunctiva/sclera: Conjunctivae normal.  Cardiovascular:     Rate and Rhythm: Normal rate and regular rhythm.     Heart sounds: Normal heart sounds.  Pulmonary:     Effort: Pulmonary effort is normal. No respiratory distress.     Breath sounds: Normal breath sounds.  Musculoskeletal:        General: Normal range of motion.     Cervical back: Normal range of motion.  Skin:    General: Skin is warm and dry.  Neurological:     General: No focal deficit present.  Mental Status: He is alert and oriented to person, place, and time. Mental status is at baseline.  Psychiatric:        Attention and Perception: Attention and perception normal.        Mood and Affect: Affect normal. Mood is anxious.        Speech: Speech normal.        Behavior: Behavior normal.        Thought Content: Thought content normal.        Judgment: Judgment normal.    Lab Results  Component Value Date   HGBA1C 5.3 09/29/2022    Lab Results  Component Value Date   CREATININE 0.97 02/09/2023   CREATININE 0.94 11/01/2022   CREATININE 0.93 09/29/2022    Lab Results  Component Value Date   WBC 4.4 09/29/2022   HGB 16.9 09/29/2022   HCT 49.2 09/29/2022   PLT 224.0 09/29/2022   GLUCOSE 79 02/09/2023   CHOL 127 09/29/2022   TRIG 79.0  09/29/2022   HDL 49.00 09/29/2022   LDLDIRECT 68.0 09/29/2022   LDLCALC 62 09/29/2022   ALT 11 02/09/2023   AST 18 02/09/2023   NA 141 02/09/2023   K 3.4 (L) 02/09/2023   CL 102 02/09/2023   CREATININE 0.97 02/09/2023   BUN 17 02/09/2023   CO2 32 02/09/2023   TSH 1.56 09/29/2022   PSA 0.33 09/29/2022   HGBA1C 5.3 09/29/2022   MICROALBUR 1.0 06/23/2021    CT CHEST WO CONTRAST Result Date: 01/24/2023 CLINICAL DATA:  Pulmonary nodule. EXAM: CT CHEST WITHOUT CONTRAST TECHNIQUE: Multidetector CT imaging of the chest was performed following the standard protocol without IV contrast. RADIATION DOSE REDUCTION: This exam was performed according to the departmental dose-optimization program which includes automated exposure control, adjustment of the mA and/or kV according to patient size and/or use of iterative reconstruction technique. COMPARISON:  01/16/2022 FINDINGS: Cardiovascular: The heart size is normal. No substantial pericardial effusion. Coronary artery calcification is evident. No thoracic aortic aneurysm. No substantial atherosclerosis of the thoracic aorta. Mediastinum/Nodes: No mediastinal lymphadenopathy. No evidence for gross hilar lymphadenopathy although assessment is limited by the lack of intravenous contrast on the current study. The esophagus has normal imaging features. There is no axillary lymphadenopathy. Lungs/Pleura: 3 mm right upper lobe nodule on 51/3 was not included in the field of view on the previous cardiac CT. 3 mm posterior right upper lobe nodule on 74/3 was also outside the field of view previously. 4 mm right upper lobe pulmonary nodule identified previously has resolved in the interval. 2 mm right lower lobe nodule on 144/3 is new in the interval. 4 mm left upper lobe nodule identified on 36/3. No overtly suspicious pulmonary nodule or mass. No focal airspace consolidation. No pleural effusion. Upper Abdomen: 2.7 cm cyst identified in the anterior right liver. No  followup imaging is recommended. Scattered tiny hypodensities in the liver parenchyma are too small to characterize but are statistically most likely benign. No followup imaging is recommended. 7 mm nonobstructing stone identified upper pole left kidney. Probable central sinus cysts in the left kidney. No followup imaging is recommended. Musculoskeletal: No worrisome lytic or sclerotic osseous abnormality. IMPRESSION: 1. Interval resolution of the 4 mm right upper lobe pulmonary nodule identified previously. 2. Additional bilateral pulmonary nodules measuring up to 4 mm that are either new or were not included in the field of view on the prior study. No follow-up needed if patient is low-risk (and has no known or suspected primary neoplasm).  Non-contrast chest CT can be considered in 12 months if patient is high-risk. This recommendation follows the consensus statement: Guidelines for Management of Incidental Pulmonary Nodules Detected on CT Images: From the Fleischner Society 2017; Radiology 2017; 284:228-243. 3. 7 mm nonobstructing stone upper pole left kidney. Electronically Signed   By: Kennith Center M.D.   On: 01/24/2023 09:49    Assessment & Plan:  .Essential hypertension Assessment & Plan: He resumed hydrochlorothiazide 12.5 mg n mid November and readings remain persistently  > 80 diastolic , with systolic readings ranging from 119 to 135.  Will add losartan if proteinuria is present; otherwise will increase hydrochlorothiazide to 25 mg    Orders: -     Microalbumin / creatinine urine ratio -     Basic metabolic panel -     Magnesium  Vitamin D deficiency -     VITAMIN D 25 Hydroxy (Vit-D Deficiency, Fractures)  Alopecia Assessment & Plan: Taking oral minoxidil and finasteride (prescribed by Dr Adolphus Birchwood)   Insomnia due to restless legs syndrome Assessment & Plan: He has deferred start of Requip.   Encouraged to try heat with compression, ,  GABA  , B6 and Magnesium first       Follow-up: Return in about 3 months (around 10/09/2023).   Sherlene Shams, MD

## 2023-07-13 ENCOUNTER — Encounter: Payer: Self-pay | Admitting: Internal Medicine

## 2023-07-13 MED ORDER — TELMISARTAN 20 MG PO TABS: 20.0000 mg | ORAL_TABLET | Freq: Every day | ORAL | 2 refills | Status: DC

## 2023-07-13 NOTE — Addendum Note (Signed)
Addended by: Sherlene Shams on: 07/13/2023 04:43 PM   Modules accepted: Orders

## 2023-07-17 DIAGNOSIS — D2262 Melanocytic nevi of left upper limb, including shoulder: Secondary | ICD-10-CM | POA: Diagnosis not present

## 2023-07-17 DIAGNOSIS — D2272 Melanocytic nevi of left lower limb, including hip: Secondary | ICD-10-CM | POA: Diagnosis not present

## 2023-07-17 DIAGNOSIS — Z85828 Personal history of other malignant neoplasm of skin: Secondary | ICD-10-CM | POA: Diagnosis not present

## 2023-07-17 DIAGNOSIS — X31XXXA Exposure to excessive natural cold, initial encounter: Secondary | ICD-10-CM | POA: Diagnosis not present

## 2023-07-17 DIAGNOSIS — D2271 Melanocytic nevi of right lower limb, including hip: Secondary | ICD-10-CM | POA: Diagnosis not present

## 2023-07-17 DIAGNOSIS — L821 Other seborrheic keratosis: Secondary | ICD-10-CM | POA: Diagnosis not present

## 2023-07-17 DIAGNOSIS — L570 Actinic keratosis: Secondary | ICD-10-CM | POA: Diagnosis not present

## 2023-07-17 DIAGNOSIS — D2261 Melanocytic nevi of right upper limb, including shoulder: Secondary | ICD-10-CM | POA: Diagnosis not present

## 2023-07-17 DIAGNOSIS — T691XXA Chilblains, initial encounter: Secondary | ICD-10-CM | POA: Diagnosis not present

## 2023-07-24 ENCOUNTER — Other Ambulatory Visit (INDEPENDENT_AMBULATORY_CARE_PROVIDER_SITE_OTHER): Payer: PPO

## 2023-07-24 DIAGNOSIS — I10 Essential (primary) hypertension: Secondary | ICD-10-CM

## 2023-07-24 DIAGNOSIS — E785 Hyperlipidemia, unspecified: Secondary | ICD-10-CM

## 2023-07-24 LAB — BASIC METABOLIC PANEL
BUN: 16 mg/dL (ref 6–23)
CO2: 28 meq/L (ref 19–32)
Calcium: 8.9 mg/dL (ref 8.4–10.5)
Chloride: 103 meq/L (ref 96–112)
Creatinine, Ser: 0.96 mg/dL (ref 0.40–1.50)
GFR: 82.29 mL/min (ref >60.00)
Glucose, Bld: 79 mg/dL (ref 70–99)
Potassium: 4.1 meq/L (ref 3.5–5.1)
Sodium: 137 meq/L (ref 135–145)

## 2023-07-25 ENCOUNTER — Encounter: Payer: Self-pay | Admitting: Internal Medicine

## 2023-08-16 IMAGING — US US AORTA
2 series · 15 of 16 positions shown · non-contrast
Comparison: None.

CLINICAL DATA: Family history of abdominal aortic aneurysm and
recent onset hypertension.

EXAM:
ULTRASOUND OF ABDOMINAL AORTA
TECHNIQUE: Ultrasound examination of the abdominal aorta and proximal common
iliac arteries was performed to evaluate for aneurysm. Additional
color and Doppler images of the distal aorta were obtained to
document patency.

[Series 1: us aorta · 12 of 13 slices shown (1 of 2)]
[im 1/13]
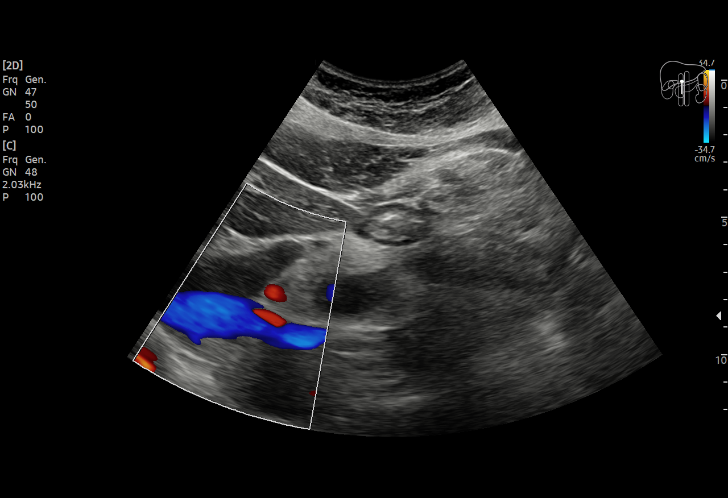
[im 2/13]
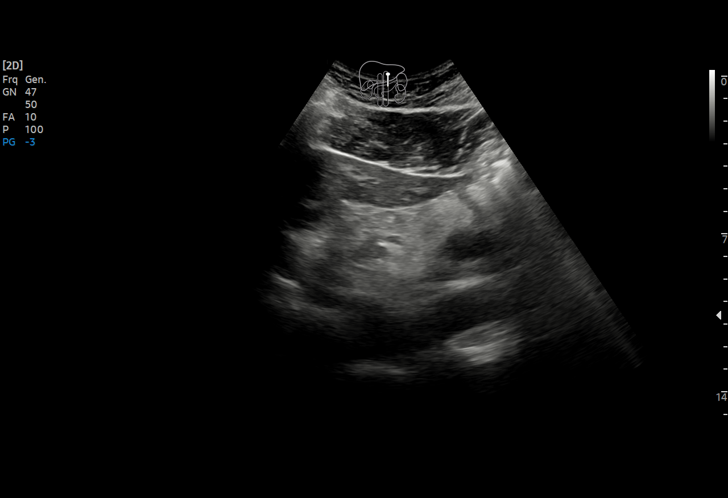
[im 3/13]
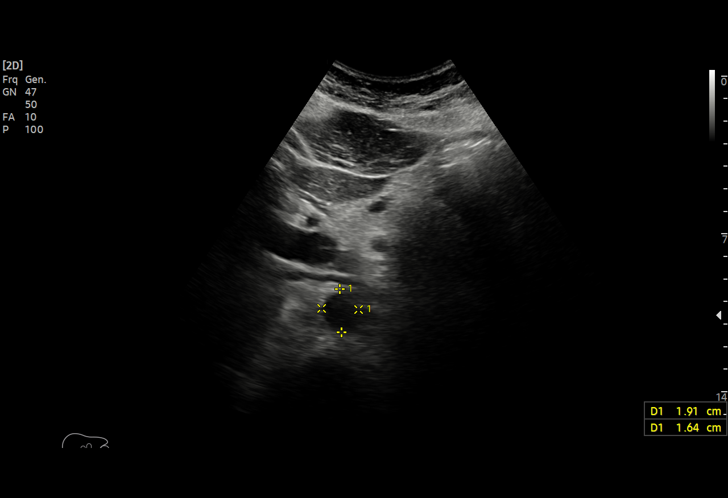
[im 4/13]
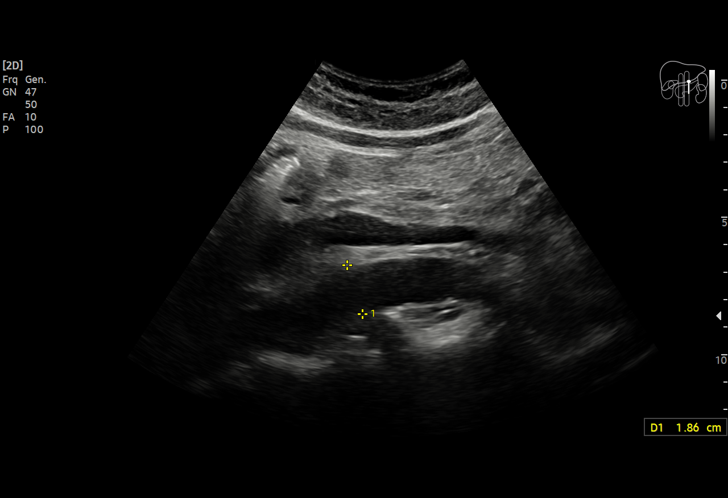
[im 5/13]
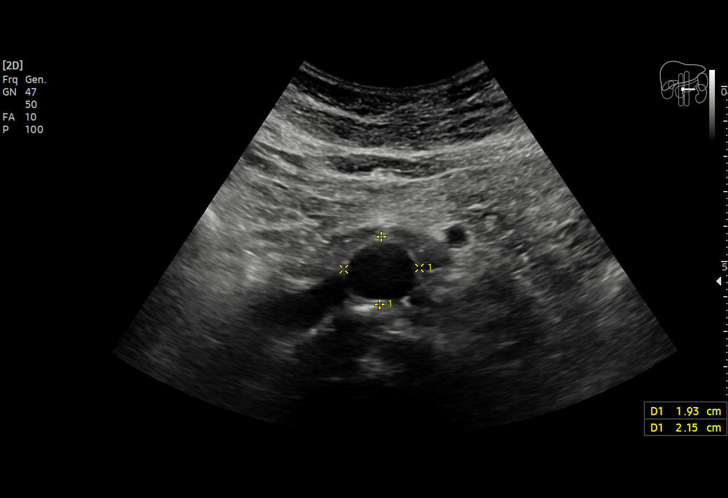
[im 6/13]
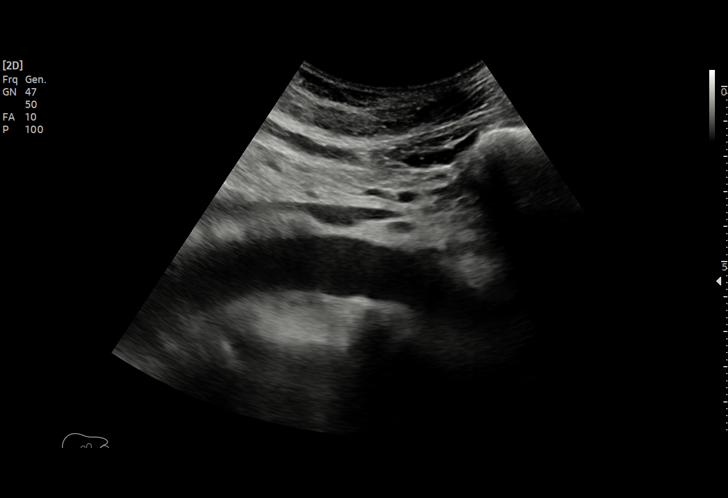
[im 7/13]
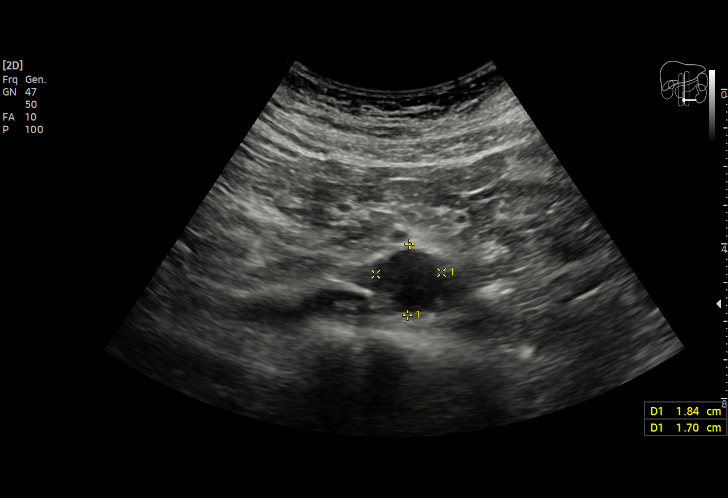
[im 9/13]
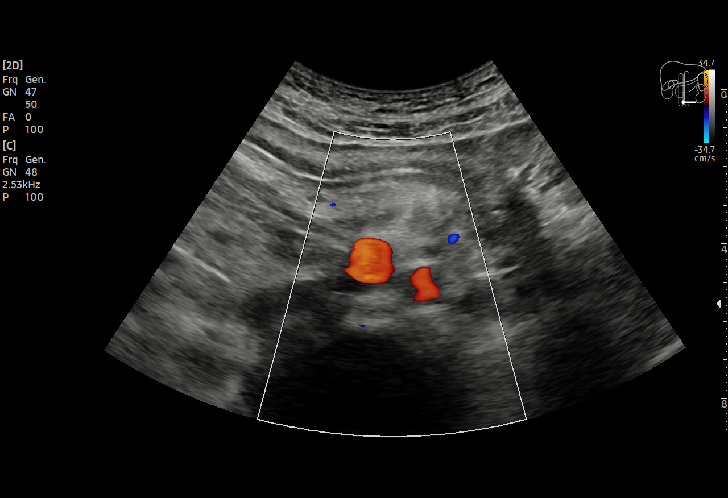
[im 10/13]
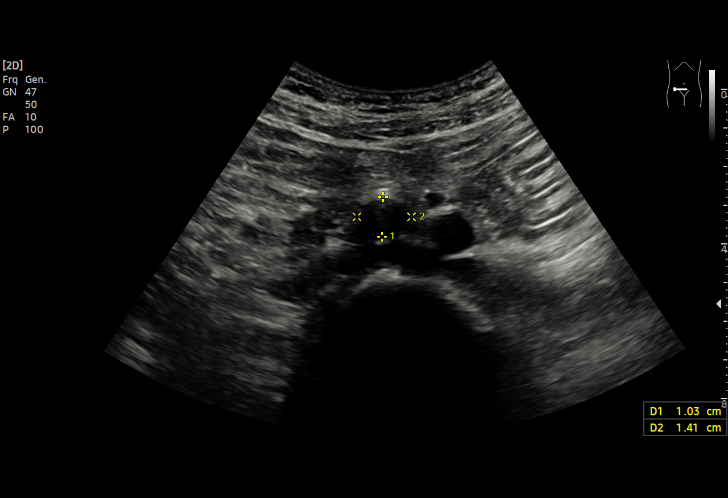
[im 11/13]
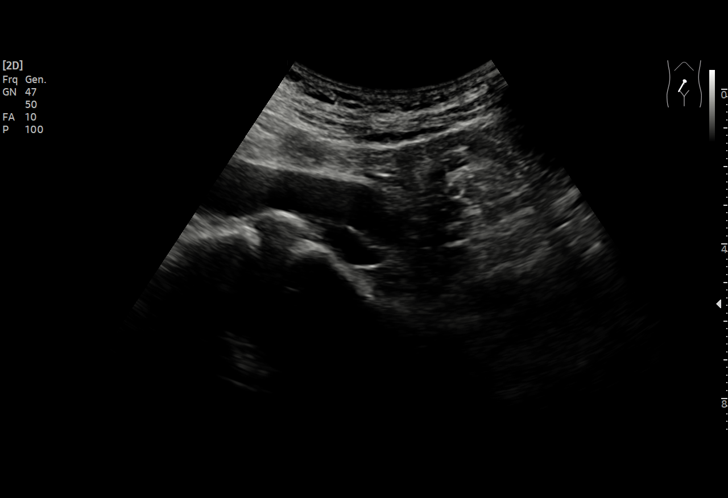
[im 12/13]
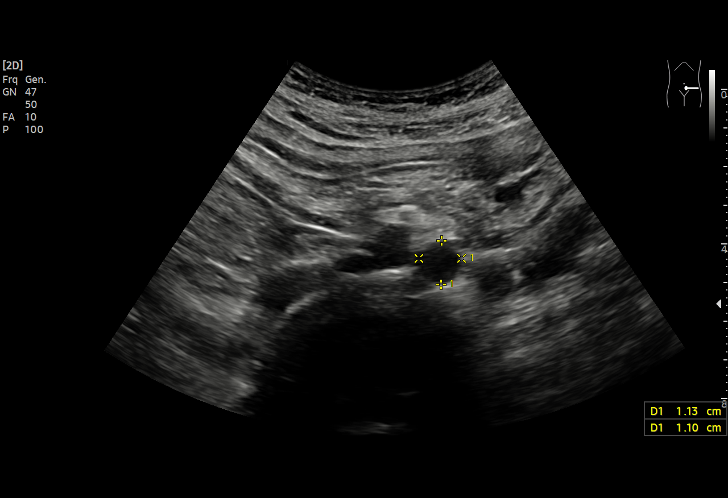
[im 13/13]
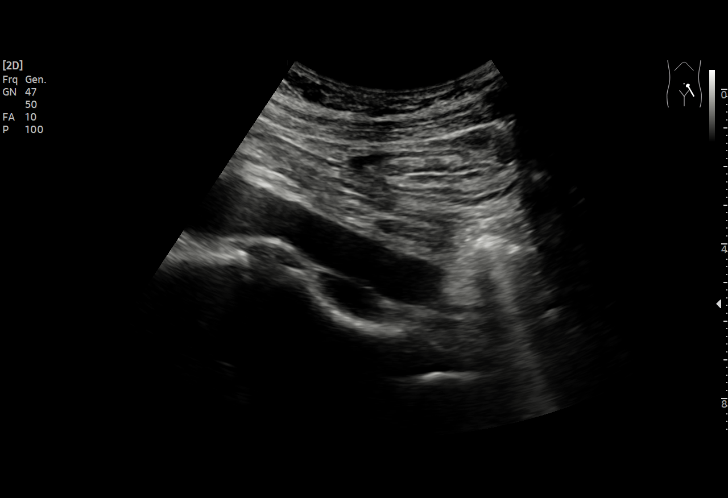

[Series 2: us aorta · 3 of 3 slices shown (2 of 2)]
[im 1/3]
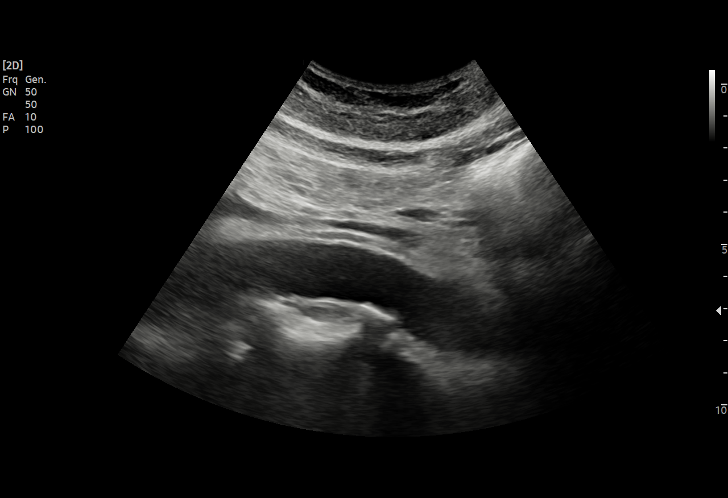
[im 2/3]
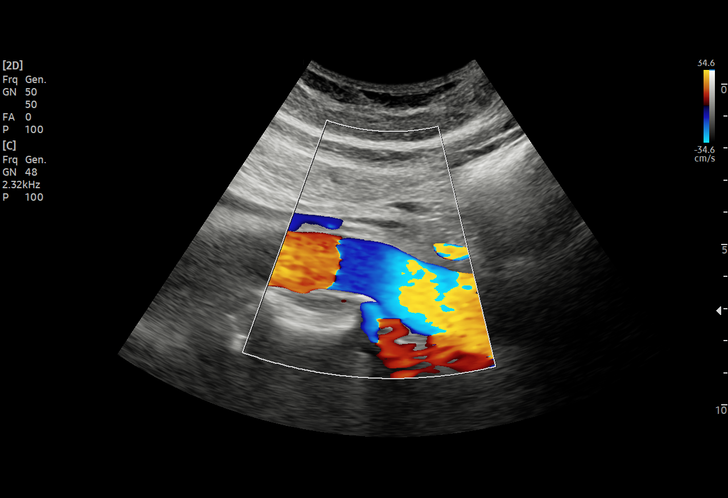
[im 3/3]
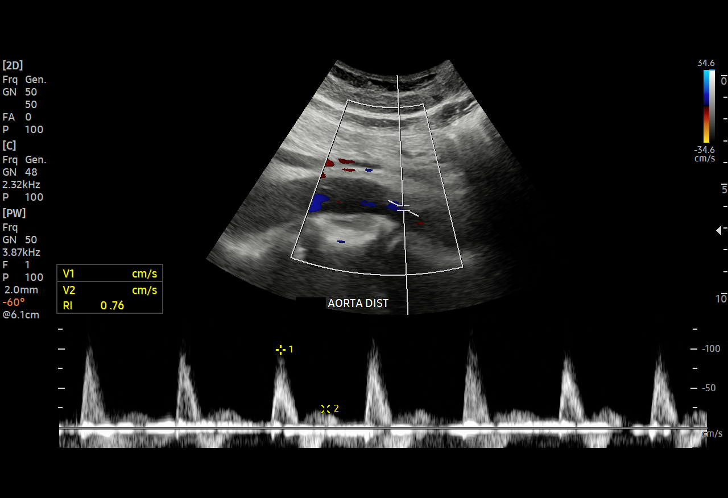

[15 of 16 positions shown; findings below may reference images not displayed]

FINDINGS: Abdominal aortic measurements as follows:

Proximal:  1.6 x 1.9 cm

Mid:  1.9 x 2.3 cm

Distal:  1.7 x 1.8 cm
Patent: Yes, peak systolic velocity is 99 cm/s

Right common iliac artery: 1.4 cm

Left common iliac artery: 1.1 cm
IMPRESSION: No evidence of abdominal aortic aneurysm.

## 2023-10-15 ENCOUNTER — Ambulatory Visit (INDEPENDENT_AMBULATORY_CARE_PROVIDER_SITE_OTHER): Payer: Medicare PPO | Admitting: Internal Medicine

## 2023-10-15 ENCOUNTER — Encounter: Payer: Self-pay | Admitting: Internal Medicine

## 2023-10-15 VITALS — BP 118/72 | HR 88 | Temp 97.7°F | Ht 74.0 in | Wt 181.8 lb

## 2023-10-15 DIAGNOSIS — I10 Essential (primary) hypertension: Secondary | ICD-10-CM

## 2023-10-15 DIAGNOSIS — Z1211 Encounter for screening for malignant neoplasm of colon: Secondary | ICD-10-CM | POA: Diagnosis not present

## 2023-10-15 DIAGNOSIS — R1013 Epigastric pain: Secondary | ICD-10-CM

## 2023-10-15 DIAGNOSIS — Z0000 Encounter for general adult medical examination without abnormal findings: Secondary | ICD-10-CM

## 2023-10-15 DIAGNOSIS — R918 Other nonspecific abnormal finding of lung field: Secondary | ICD-10-CM

## 2023-10-15 DIAGNOSIS — Z125 Encounter for screening for malignant neoplasm of prostate: Secondary | ICD-10-CM

## 2023-10-15 DIAGNOSIS — E785 Hyperlipidemia, unspecified: Secondary | ICD-10-CM

## 2023-10-15 MED ORDER — OMEPRAZOLE 20 MG PO CPDR: 20.0000 mg | DELAYED_RELEASE_CAPSULE | Freq: Every day | ORAL | 0 refills | Status: DC

## 2023-10-15 MED ORDER — TELMISARTAN 20 MG PO TABS: 20.0000 mg | ORAL_TABLET | Freq: Every day | ORAL | 1 refills | Status: DC

## 2023-10-15 NOTE — Patient Instructions (Signed)
 I recommend a trial of omeprazole (Prilosec) at bedtine for the morning gastritis symptoms.  Let me know if it helps  If Dr Media Spikes doesn't order the CT abd and pelvis with the chest CT, let me know and I will add it on   Your blood pressure readings are EXCELLENT .  If you develop symptoms of dizziness or extreme fatigue let me know

## 2023-10-15 NOTE — Progress Notes (Unsigned)
 Patient ID: Bobby Mills, male    DOB: [birth year]  Age: 68 y.o. MRN: [id number]  The patient is here for annual preventive examination and management of other chronic and acute problems.   The risk factors are reflected in the social history.   The roster of all physicians providing medical care to patient - is listed in the Snapshot section of the chart.   Activities of daily living:  The patient is 100% independent in all ADLs: dressing, toileting, feeding as well as independent mobility   Home safety : The patient has smoke detectors in the home. They wear seatbelts.  There are no unsecured firearms at home. There is no violence in the home.    There is no risks for hepatitis, STDs or HIV. There is no   history of blood transfusion. They have no travel history to infectious disease endemic areas of the world.   The patient has seen their dentist in the last six month. They have seen their eye doctor in the last year. The patinet  denies slight hearing difficulty with regard to whispered voices and some television programs.  They have deferred audiologic testing in the last year.  They do not  have excessive sun exposure. Discussed the need for sun protection: hats, long sleeves and use of sunscreen if there is significant sun exposure.    Diet: the importance of a healthy diet is discussed. They do have a healthy diet.   The benefits of regular aerobic exercise were discussed. The patient  exercises  3 to 5 days per week  for  60 minutes.    Depression screen: there are no signs or vegative symptoms of depression- irritability, change in appetite, anhedonia, sadness/tearfullness.   The following portions of the patient's history were reviewed and updated as appropriate: allergies, current medications, past family history, past medical history,  past surgical history, past social history  and problem list.   Visual acuity was not assessed per patient preference since the patient has regular  follow up with an  ophthalmologist. Hearing and body mass index were assessed and reviewed.    During the course of the visit the patient was educated and counseled about appropriate screening and preventive services including : fall prevention , diabetes screening, nutrition counseling, colorectal cancer screening, and recommended immunizations.    Chief Complaint:  HPI     Medical Management of Chronic Issues    Additional comments: 6  month follow up       Last edited by Chadwick Colonel, CMA on 10/15/2023  1:57 PM.        Review of Symptoms  Patient denies headache, fevers, malaise, unintentional weight loss, skin rash, eye pain, sinus congestion and sinus pain, sore throat, dysphagia,  hemoptysis , cough, dyspnea, wheezing, chest pain, palpitations, orthopnea, edema, abdominal pain, nausea, melena, diarrhea, constipation, flank pain, dysuria, hematuria, urinary  Frequency, nocturia, numbness, tingling, seizures,  Focal weakness, Loss of consciousness,  Tremor, insomnia, depression, anxiety, and suicidal ideation.    Physical Exam:  BP 118/72   Pulse 88   Temp 97.7 F (36.5 C) (Oral)   Ht 6\' 2"  (1.88 m)   Wt 181 lb 12.8 oz (82.5 kg)   SpO2 98%   BMI 23.34 kg/m    Physical Exam  Assessment and Plan: Colon cancer screening -     Ambulatory referral to Gastroenterology  Essential hypertension Assessment & Plan: He had resumed hydrochlorothiazide  12.5 mg in mid November and readings remain persistently  >  80 diastolic , with systolic readings ranging from 119 to 135. We  Stopped hydrochlorothiazide  and added  telmisartan  20 mg  given new onset microalbuminuria  Lab Results  Component Value Date   LABMICR See below: 02/16/2022   MICROALBUR 6.9 (H) 07/11/2023   MICROALBUR 1.0 06/23/2021        Prostate cancer screening -     PSA, Medicare  Other orders -     Telmisartan ; Take 1 tablet (20 mg total) by mouth daily.  Dispense: 90 tablet; Refill: 1    No  follow-ups on file.  Thersia Flax, MD

## 2023-10-15 NOTE — Assessment & Plan Note (Addendum)
 He had resumed hydrochlorothiazide  12.5 mg in mid November and readings remain persistently  > 80 diastolic , with systolic readings ranging from 119 to 135. We  Stopped hydrochlorothiazide  and added  telmisartan  20 mg  given new onset microalbuminuria  Lab Results  Component Value Date   LABMICR See below: 02/16/2022   MICROALBUR 6.9 (H) 07/11/2023   MICROALBUR 1.0 06/23/2021

## 2023-10-15 NOTE — Assessment & Plan Note (Signed)

## 2023-10-16 ENCOUNTER — Encounter: Payer: Self-pay | Admitting: Internal Medicine

## 2023-10-16 DIAGNOSIS — R1013 Epigastric pain: Secondary | ICD-10-CM | POA: Insufficient documentation

## 2023-10-16 LAB — LIPID PANEL
Cholesterol: 113 mg/dL (ref 0–200)
HDL: 41.6 mg/dL (ref >39.00)
LDL Cholesterol: 52 mg/dL (ref 0–99)
NonHDL: 70.99
Total CHOL/HDL Ratio: 3
Triglycerides: 95 mg/dL (ref 0.0–149.0)
VLDL: 19 mg/dL (ref 0.0–40.0)

## 2023-10-16 LAB — COMPREHENSIVE METABOLIC PANEL WITH GFR
ALT: 17 U/L (ref 0–53)
AST: 18 U/L (ref 0–37)
Albumin: 4.2 g/dL (ref 3.5–5.2)
Alkaline Phosphatase: 75 U/L (ref 39–117)
BUN: 17 mg/dL (ref 6–23)
CO2: 29 meq/L (ref 19–32)
Calcium: 9.4 mg/dL (ref 8.4–10.5)
Chloride: 103 meq/L (ref 96–112)
Creatinine, Ser: 0.88 mg/dL (ref 0.40–1.50)
GFR: 89.38 mL/min (ref >60.00)
Glucose, Bld: 95 mg/dL (ref 70–99)
Potassium: 4 meq/L (ref 3.5–5.1)
Sodium: 138 meq/L (ref 135–145)
Total Bilirubin: 0.7 mg/dL (ref 0.2–1.2)
Total Protein: 6.4 g/dL (ref 6.0–8.3)

## 2023-10-16 LAB — LDL CHOLESTEROL, DIRECT: Direct LDL: 58 mg/dL

## 2023-10-16 LAB — PSA, MEDICARE: PSA: 0.36 ng/mL (ref 0.10–4.00)

## 2023-10-16 NOTE — Assessment & Plan Note (Signed)
 Symptoms present upon waking . Trial of at bedtime PPI

## 2023-10-16 NOTE — Assessment & Plan Note (Signed)
 Tolerating atorvastatin   But at lower dose than recommended by cardiology.   LFTs and LDL are due for repeat  Lab Results  Component Value Date   CHOL 127 09/29/2022   HDL 49.00 09/29/2022   LDLCALC 62 09/29/2022   LDLDIRECT 68.0 09/29/2022   TRIG 79.0 09/29/2022   CHOLHDL 3 09/29/2022    Lab Results  Component Value Date   ALT 11 02/09/2023   AST 18 02/09/2023   ALKPHOS 67 02/09/2023   BILITOT 0.8 02/09/2023

## 2023-10-16 NOTE — Assessment & Plan Note (Signed)
 Seen on  CT chest.. all subcentimeter.  He is a lifelong   Non smoker.  He has annual  follow up with pulmonary

## 2023-11-08 ENCOUNTER — Encounter: Payer: Self-pay | Admitting: Student in an Organized Health Care Education/Training Program

## 2023-11-08 ENCOUNTER — Ambulatory Visit: Admitting: Student in an Organized Health Care Education/Training Program

## 2023-11-08 VITALS — BP 104/64 | HR 54 | Ht 74.0 in | Wt 178.0 lb

## 2023-11-08 DIAGNOSIS — R911 Solitary pulmonary nodule: Secondary | ICD-10-CM | POA: Diagnosis not present

## 2023-11-08 DIAGNOSIS — Z87891 Personal history of nicotine dependence: Secondary | ICD-10-CM

## 2023-11-08 NOTE — Progress Notes (Signed)
 Assessment & Plan:   1. Solitary pulmonary nodule (Primary)  Presents for follow up of his most recent chest CT. Initial CT had shown a 4 mm RUL nodule which is resolved on the repeat CT from last year. The new CT does show multiple sub-centimeter pulmonary nodules which we will follow up with a repeat CT at the 1 year mark. I did counsel the patient that should the nodules be stable or resolve, we will stop any further radiographic monitoring. Patient was also noted to have a 7 mm non-obstructive stone in the upper pole of the left kidney which we will monitor with a renal stone study as well.  - CT CHEST WO CONTRAST; Future - CT RENAL STONE STUDY; Future   Return in about 3 months (around 02/08/2024).  I spent 30 minutes caring for this patient today, including preparing to see the patient, obtaining a medical history , reviewing a separately obtained history, performing a medically appropriate examination and/or evaluation, counseling and educating the patient/family/caregiver, ordering medications, tests, or procedures, documenting clinical information in the electronic health record, and independently interpreting results (not separately reported/billed) and communicating results to the patient/family/caregiver  Bobby Glasgow, MD [facility]   End of visit medications:  No orders of the defined types were placed in this encounter.    Current Outpatient Medications:    ALPRAZolam  (XANAX ) 0.25 MG tablet, TAKE 1/2 TABLET BY MOUTH DAILY AS NEEDEDFOR ANXIETY OR SLEEP, Disp: 20 tablet, Rfl: 5   atorvastatin  (LIPITOR) 20 MG tablet, TAKE 1 TABLET BY MOUTH DAILY, Disp: 90 tablet, Rfl: 3   minoxidil  (LONITEN ) 2.5 MG tablet, Take 0.5 tablets (1.25 mg total) by mouth daily., Disp: 30 tablet, Rfl: 3   omeprazole  (PRILOSEC) 20 MG capsule, Take 1 capsule (20 mg total) by mouth daily., Disp: 30 capsule, Rfl: 0   telmisartan  (MICARDIS ) 20 MG tablet, Take 1 tablet (20 mg  total) by mouth daily., Disp: 90 tablet, Rfl: 1   triamcinolone (KENALOG) 0.1 %, Apply topically 2 (two) times daily., Disp: , Rfl:    XDEMVY 0.25 % SOLN, Apply 1 drop to eye 2 (two) times daily., Disp: , Rfl:    Subjective:   PATIENT ID: Bobby Mills GENDER: male DOB: [birth year], MRN: [id number]  Chief Complaint  Patient presents with   Lung Mass    HPI  Bobby Mills is a pleasant 68 year old male patient presenting to clinic for follow up of a pulmonary nodule.  He remains in his usual state of health and has no symptoms. He's had no shortness of breath, cough, sputum production, or wheeze. No fevers reported.  Patient initially presented to clinic for the evaluation of a pulmonary nodule noted on cardiac CT. The CT was noted to have a 4 millimeter pulmonary nodule for which 1 year follow-up was recommended.  Following our last visit, a repeat CT was ordered which he obtained and he is here to discuss its results.    He was originally from [place]  but lived throughout the [place]  for most of his life and moved back here 5 years ago.  He did spend time in [facility], [place] , in [place] .  He has a distant past medical history of smoking with between 2.5 pack years of smoking history.  He quit in 2009.  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  Ancillary information including prior medications, full medical/surgical/family/social histories, and PFTs (when available) are listed below and have been reviewed.  Review of Systems  Constitutional:  Negative for chills, fever, malaise/fatigue and weight loss.  Respiratory:  Negative for cough, hemoptysis, sputum production, shortness of breath and wheezing.   Cardiovascular:  Negative for chest pain and PND.     Objective:   Vitals:   11/08/23 1141  BP: 104/64  Pulse: (!) 54  SpO2: 98%  Weight: 178 lb (80.7 kg)  Height: 6\' 2"  (1.88 m)   98% on RA BMI Readings from Last 3 Encounters:   11/08/23 22.85 kg/m  10/15/23 23.34 kg/m  07/11/23 23.44 kg/m   Wt Readings from Last 3 Encounters:  11/08/23 178 lb (80.7 kg)  10/15/23 181 lb 12.8 oz (82.5 kg)  07/11/23 182 lb 9.6 oz (82.8 kg)    Physical Exam Constitutional:      Appearance: Normal appearance.  Cardiovascular:     Rate and Rhythm: Normal rate and regular rhythm.     Pulses: Normal pulses.     Heart sounds: Normal heart sounds.  Pulmonary:     Effort: Pulmonary effort is normal.     Breath sounds: Normal breath sounds.  Neurological:     Mental Status: He is alert.       Ancillary Information    Past Medical History:  Diagnosis Date   Allergy    seasonal   History of colon polyps    Kidney stone      Family History  Problem Relation Age of Onset   Hearing loss Mother    Hypertension Mother    Hearing loss Father    Hyperlipidemia Father    Heart disease Father 71       CAD, 7 vessel bypass at age 65    Cancer Father    Heart failure Paternal Grandmother    Heart attack Paternal Grandfather      No past surgical history on file.  Social History   Socioeconomic History   Marital status: Single    Spouse name: Not on file   Number of children: Not on file   Years of education: Not on file   Highest education level: Bachelor's degree (e.g., BA, AB, BS)  Occupational History   Occupation: Quarry manager  Tobacco Use   Smoking status: Former    Current packs/day: 0.00    Average packs/day: 0.1 packs/day for 25.0 years (2.5 ttl pk-yrs)    Types: Cigarettes    Start date: 83    Quit date: 2009    Years since quitting: 16.3    Passive exposure: Past   Smokeless tobacco: Never  Substance and Sexual Activity   Alcohol use: No   Drug use: No   Sexual activity: Never  Other Topics Concern   Not on file  Social History Narrative   Patient has retired from his [place] job and  relocated to [place] from [place],  To assist his parents in their old age.  He has since  moved in with his mother since his father passed.    Social Drivers of Corporate investment banker Strain: Low Risk  (07/10/2023)   Overall Financial Resource Strain (CARDIA)    Difficulty of Paying Living Expenses: Not hard at all  Food Insecurity: No Food Insecurity (07/10/2023)   Hunger Vital Sign    Worried About Running Out of Food in the Last Year: Never true    Ran Out of Food in the Last Year: Never true  Transportation Needs: No Transportation Needs (07/10/2023)   PRAPARE - Transportation    Lack  of Transportation (Medical): No    Lack of Transportation (Non-Medical): No  Physical Activity: Insufficiently Active (07/10/2023)   Exercise Vital Sign    Days of Exercise per Week: 1 day    Minutes of Exercise per Session: 30 min  Stress: No Stress Concern Present (07/10/2023)   [employer] of Occupational Health - Occupational Stress Questionnaire    Feeling of Stress : Not at all  Social Connections: Moderately Integrated (07/10/2023)   Social Connection and Isolation Panel [NHANES]    Frequency of Communication with Friends and Family: More than three times a week    Frequency of Social Gatherings with Friends and Family: Once a week    Attends Religious Services: More than 4 times per year    Active Member of Clubs or Organizations: Yes    Attends Banker Meetings: Never    Marital Status: Never married  Intimate Partner Violence: Not At Risk (06/24/2017)   Humiliation, Afraid, Rape, and Kick questionnaire    Fear of Current or Ex-Partner: No    Emotionally Abused: No    Physically Abused: No    Sexually Abused: No     No Known Allergies   CBC    Component Value Date/Time   WBC 4.4 09/29/2022 1015   RBC 5.18 09/29/2022 1015   HGB 16.9 09/29/2022 1015   HCT 49.2 09/29/2022 1015   PLT 224.0 09/29/2022 1015   MCV 94.8 09/29/2022 1015   MCHC 34.3 09/29/2022 1015   RDW 13.2 09/29/2022 1015   LYMPHSABS 1.3 09/29/2022 1015   MONOABS 0.5 09/29/2022 1015    EOSABS 0.1 09/29/2022 1015   BASOSABS 0.1 09/29/2022 1015    Pulmonary Functions Testing Results:     No data to display          Outpatient Medications Prior to Visit  Medication Sig Dispense Refill   ALPRAZolam  (XANAX ) 0.25 MG tablet TAKE 1/2 TABLET BY MOUTH DAILY AS NEEDEDFOR ANXIETY OR SLEEP 20 tablet 5   atorvastatin  (LIPITOR) 20 MG tablet TAKE 1 TABLET BY MOUTH DAILY 90 tablet 3   minoxidil  (LONITEN ) 2.5 MG tablet Take 0.5 tablets (1.25 mg total) by mouth daily. 30 tablet 3   omeprazole  (PRILOSEC) 20 MG capsule Take 1 capsule (20 mg total) by mouth daily. 30 capsule 0   telmisartan  (MICARDIS ) 20 MG tablet Take 1 tablet (20 mg total) by mouth daily. 90 tablet 1   triamcinolone (KENALOG) 0.1 % Apply topically 2 (two) times daily.     XDEMVY 0.25 % SOLN Apply 1 drop to eye 2 (two) times daily.     rOPINIRole  (REQUIP ) 0.25 MG tablet Take 1 tablet (0.25 mg total) by mouth 3 (three) times daily. 90 tablet 1   No facility-administered medications prior to visit.

## 2024-01-17 DIAGNOSIS — S0502XA Injury of conjunctiva and corneal abrasion without foreign body, left eye, initial encounter: Secondary | ICD-10-CM | POA: Diagnosis not present

## 2024-01-17 DIAGNOSIS — H1045 Other chronic allergic conjunctivitis: Secondary | ICD-10-CM | POA: Diagnosis not present

## 2024-01-21 ENCOUNTER — Encounter: Payer: Self-pay | Admitting: Urology

## 2024-01-21 DIAGNOSIS — S0502XA Injury of conjunctiva and corneal abrasion without foreign body, left eye, initial encounter: Secondary | ICD-10-CM | POA: Diagnosis not present

## 2024-01-21 DIAGNOSIS — H1045 Other chronic allergic conjunctivitis: Secondary | ICD-10-CM | POA: Diagnosis not present

## 2024-01-24 ENCOUNTER — Ambulatory Visit: Admission: RE | Admit: 2024-01-24 | Source: Ambulatory Visit

## 2024-01-24 ENCOUNTER — Ambulatory Visit
Admission: RE | Admit: 2024-01-24 | Discharge: 2024-01-24 | Disposition: A | Discharge status: Home / Self Care | Source: Ambulatory Visit | Attending: Student in an Organized Health Care Education/Training Program | Admitting: Student in an Organized Health Care Education/Training Program

## 2024-01-24 DIAGNOSIS — R911 Solitary pulmonary nodule: Secondary | ICD-10-CM | POA: Diagnosis not present

## 2024-01-24 DIAGNOSIS — N281 Cyst of kidney, acquired: Secondary | ICD-10-CM | POA: Insufficient documentation

## 2024-01-24 DIAGNOSIS — N400 Enlarged prostate without lower urinary tract symptoms: Secondary | ICD-10-CM | POA: Diagnosis not present

## 2024-01-24 DIAGNOSIS — K5730 Diverticulosis of large intestine without perforation or abscess without bleeding: Secondary | ICD-10-CM | POA: Insufficient documentation

## 2024-01-24 DIAGNOSIS — K5900 Constipation, unspecified: Secondary | ICD-10-CM | POA: Insufficient documentation

## 2024-01-24 DIAGNOSIS — I2510 Atherosclerotic heart disease of native coronary artery without angina pectoris: Secondary | ICD-10-CM | POA: Diagnosis not present

## 2024-01-24 DIAGNOSIS — K449 Diaphragmatic hernia without obstruction or gangrene: Secondary | ICD-10-CM | POA: Insufficient documentation

## 2024-01-24 DIAGNOSIS — I700 Atherosclerosis of aorta: Secondary | ICD-10-CM | POA: Insufficient documentation

## 2024-01-24 DIAGNOSIS — R918 Other nonspecific abnormal finding of lung field: Secondary | ICD-10-CM | POA: Diagnosis not present

## 2024-01-24 DIAGNOSIS — N1330 Unspecified hydronephrosis: Secondary | ICD-10-CM | POA: Diagnosis not present

## 2024-01-24 DIAGNOSIS — N132 Hydronephrosis with renal and ureteral calculous obstruction: Secondary | ICD-10-CM | POA: Insufficient documentation

## 2024-02-22 ENCOUNTER — Ambulatory Visit: Payer: Self-pay | Admitting: Student in an Organized Health Care Education/Training Program

## 2024-02-26 ENCOUNTER — Ambulatory Visit: Admitting: Urology

## 2024-02-26 VITALS — BP 128/79 | HR 75 | Wt 180.0 lb

## 2024-02-26 DIAGNOSIS — Z125 Encounter for screening for malignant neoplasm of prostate: Secondary | ICD-10-CM | POA: Diagnosis not present

## 2024-02-26 DIAGNOSIS — R351 Nocturia: Secondary | ICD-10-CM | POA: Diagnosis not present

## 2024-02-26 DIAGNOSIS — N200 Calculus of kidney: Secondary | ICD-10-CM

## 2024-02-26 LAB — BLADDER SCAN AMB NON-IMAGING

## 2024-02-26 NOTE — Progress Notes (Signed)
   02/26/2024 2:21 PM   Bobby Mills 07/05/1955 [id number]  Reason for visit: Follow up nephrolithiasis, nocturia, PSA screening, urinary symptoms  History: Mild urinary symptoms not on medications, nocturia improved with behavioral strategies Normal PSA values Incidental left upper pole renal stone on chest CT on surveillance  Physical Exam: BP 128/79 (BP Location: Left Arm, Patient Position: Sitting, Cuff Size: Normal)   Pulse 75   Wt 180 lb (81.6 kg)   SpO2 100%   BMI 23.11 kg/m   Imaging/labs: I personally viewed and interpreted the CT stone protocol dated 01/24/2024 showing a large 1.3 cm right renal pelvis stone(density 1400HU, 10cm SSD), stable 5 mm left upper pole stone PSA April 2025 0.36, corrected for finasteride 0.72  Today: Denies any flank pain, PVR normal at 0ml He is interested in having the large right renal pelvis stone treated, prefers shockwave lithotripsy  Plan:   68 year old male with 1.3 cm right renal pelvis stone with some mild hydronephrosis, asymptomatic, he desires treatment.  He would like to avoid a ureteral stent and prefers shockwave.  We discussed various treatment options for urolithiasis including observation with or without medical expulsive therapy, shockwave lithotripsy (SWL), ureteroscopy and laser lithotripsy with stent placement, and percutaneous nephrolithotomy.We discussed that management is based on stone size, location, density, patient co-morbidities, and patient preference. Stones <45mm in size have a >80% spontaneous passage rate. Data surrounding the use of tamsulosin  for medical expulsive therapy is controversial, but meta analyses suggests it is most efficacious for distal stones between 5-61mm in size. Possible side effects include dizziness/lightheadedness, and retrograde ejaculation.SWL has a lower stone free rate in a single procedure, but also a lower complication rate compared to ureteroscopy and avoids a stent and associated  stent related symptoms. Possible complications include renal hematoma, steinstrasse, and need for additional treatment.Ureteroscopy with laser lithotripsy and stent placement has a higher stone free rate than SWL in a single procedure, however increased complication rate including possible infection, ureteral injury, bleeding, and stent related morbidity. Common stent related symptoms include dysuria, urgency/frequency, and flank pain.  Schedule right shockwave lithotripsy    Bobby JAYSON Burnet, MD  [facility] [address] [place], [place] [address] [phone]

## 2024-02-26 NOTE — H&P (View-Only) (Signed)
   02/26/2024 2:21 PM   Bobby Mills 07/05/1955 [id number]  Reason for visit: Follow up nephrolithiasis, nocturia, PSA screening, urinary symptoms  History: Mild urinary symptoms not on medications, nocturia improved with behavioral strategies Normal PSA values Incidental left upper pole renal stone on chest CT on surveillance  Physical Exam: BP 128/79 (BP Location: Left Arm, Patient Position: Sitting, Cuff Size: Normal)   Pulse 75   Wt 180 lb (81.6 kg)   SpO2 100%   BMI 23.11 kg/m   Imaging/labs: I personally viewed and interpreted the CT stone protocol dated 01/24/2024 showing a large 1.3 cm right renal pelvis stone(density 1400HU, 10cm SSD), stable 5 mm left upper pole stone PSA April 2025 0.36, corrected for finasteride 0.72  Today: Denies any flank pain, PVR normal at 0ml He is interested in having the large right renal pelvis stone treated, prefers shockwave lithotripsy  Plan:   68 year old male with 1.3 cm right renal pelvis stone with some mild hydronephrosis, asymptomatic, he desires treatment.  He would like to avoid a ureteral stent and prefers shockwave.  We discussed various treatment options for urolithiasis including observation with or without medical expulsive therapy, shockwave lithotripsy (SWL), ureteroscopy and laser lithotripsy with stent placement, and percutaneous nephrolithotomy.We discussed that management is based on stone size, location, density, patient co-morbidities, and patient preference. Stones <45mm in size have a >80% spontaneous passage rate. Data surrounding the use of tamsulosin  for medical expulsive therapy is controversial, but meta analyses suggests it is most efficacious for distal stones between 5-61mm in size. Possible side effects include dizziness/lightheadedness, and retrograde ejaculation.SWL has a lower stone free rate in a single procedure, but also a lower complication rate compared to ureteroscopy and avoids a stent and associated  stent related symptoms. Possible complications include renal hematoma, steinstrasse, and need for additional treatment.Ureteroscopy with laser lithotripsy and stent placement has a higher stone free rate than SWL in a single procedure, however increased complication rate including possible infection, ureteral injury, bleeding, and stent related morbidity. Common stent related symptoms include dysuria, urgency/frequency, and flank pain.  Schedule right shockwave lithotripsy    Bobby JAYSON Burnet, MD  [facility] [address] [place], [place] [address] [phone]

## 2024-02-26 NOTE — Patient Instructions (Addendum)
 ESWL for Kidney Stones  Extracorporeal shock wave lithotripsy (ESWL) is a treatment that can help break up kidney stones that are too large to pass on their own.  This is a nonsurgical procedure that breaks up a kidney stone with shock waves. These shock waves pass through your body and focus on the kidney stone. They cause the kidney stone to break into smaller pieces (fragments) while it is still in the urinary tract. The fragments of stone can pass more easily out of your body in the pee (urine). Tell a health care provider about: Any allergies you have. All medicines you are taking, including vitamins, herbs, eye drops, creams, and over-the-counter medicines. Any problems you or family members have had with anesthetic medicines. Any bleeding problems you have. Any surgeries you've had. Any medical conditions you have. Whether you're pregnant or may be pregnant. What are the risks? Your health care provider will talk with you about risks. These may include: Infection. Bleeding from the kidney. Bruising of the kidney or skin. Scarring of the kidney. This can lead to: Increased blood pressure. Poor kidney function. Return (recurrence) of kidney stones. Damage to other structures or organs. This may include the liver, colon, spleen, or pancreas. Blockage (obstruction) of the tube that carries pee from the kidney to the bladder (ureter). Failure of the kidney stone to break into fragments. What happens before the procedure? When to stop eating and drinking Follow instructions from your health care provider about what you may eat and drink. These may include: 8 hours before your procedure Stop eating most foods. Do not eat meat, fried foods, or fatty foods. Eat only light foods, such as toast or crackers. All liquids are okay except energy drinks and alcohol. 6 hours before your procedure Stop eating. Drink only clear liquids, such as water, clear fruit juice, black coffee, plain tea,  and sports drinks. Do not drink energy drinks or alcohol. 2 hours before your procedure Stop drinking all liquids. You may be allowed to take medicines with small sips of water. If you do not follow your health care provider's instructions, your procedure may be delayed or canceled. Medicines Ask your health care provider about: Changing or stopping your regular medicines. These include any diabetes medicines or blood thinners you take. Taking medicines such as aspirin and ibuprofen. These medicines can thin your blood. Do not take them unless your health care provider tells you to. Taking over-the-counter medicines, vitamins, herbs, and supplements. Tests You may have tests, such as: Blood tests. Pee (urine) tests. Imaging tests. This may include a CT scan. Surgery safety Ask your health care provider: How your surgery site will be marked. What steps will be taken to help prevent infection. These steps may include: Washing skin with a soap that kills germs. Receiving antibiotics. General instructions If you will be going home right after the procedure, plan to have a responsible adult: Take you home from the hospital or clinic. You will not be allowed to drive. Care for you for the time you are told. What happens during the procedure?  An IV will be inserted into one of your veins. You may be given: A sedative. This helps you relax. Anesthesia. This will: Numb certain areas of your body. Make you fall asleep for surgery. A water-filled cushion may be placed behind your kidney or on your abdomen. In some cases, you may be placed in a tub of lukewarm water. Your body will be positioned in a way that makes it  easier to target the kidney stone. An X-ray or ultrasound exam will be done to locate your stone. Shock waves will be aimed at the stone. If you are awake, you may feel a tapping sensation as the shock waves pass through your body. A small mesh tube (stent) may be placed in  your ureter. This will help keep pee flowing from the kidney if the fragments of the stone have been blocking the ureter. The stent will be removed at a later time by your health care provider. The procedure may vary among health care providers and hospitals. What happens after the procedure? Your blood pressure, heart rate, breathing rate, and blood oxygen level will be monitored until you leave the hospital or clinic. You may have an X-ray after the procedure to see how many of the kidney stones were broken up. This will also show how much of the stone has passed. If there are still large fragments after treatment, you may need to have a second procedure at a later time. This information is not intended to replace advice given to you by your health care provider. Make sure you discuss any questions you have with your health care provider. Nocturia refers to the need to wake up during the night to urinate, which can disrupt your sleep and impact your overall well-being. Fortunately, there are several strategies you can employ to help prevent or manage nocturia. It's important to consult with your healthcare provider before making any significant changes to your routine. Here are some helpful strategies to consider:  Limit Fluid Intake Before Bed: Avoid drinking large amounts of fluids in the evening, especially within a few hours of bedtime. Consume most of your daily fluid intake earlier in the day to reduce the need to urinate at night.  Monitor Your Diet: Limit your intake of caffeine and alcohol, as these substances can increase urine production and irritate the bladder.  Avoid diet, zero calorie, and artificially sweetened drinks, especially sodas, in the afternoon or evening. Be mindful of consuming foods and drinks with high water content before bedtime, such as watermelon and herbal teas.  Time Your Medications: If you're taking medications that contribute to increased urination, consult  your healthcare provider about adjusting the timing of these medications to minimize their impact during the night.  Practice Double Voiding: Before going to bed, make an effort to empty your bladder twice within a short period. This can help reduce the amount of urine left in your bladder before sleep.  Bladder Training: Gradually increase the time between bathroom visits during the day to train your bladder to hold larger volumes of urine. Over time, this can help reduce the frequency of nighttime awakenings to urinate.  Elevate Your Legs During the Day: Elevating your legs during the day can help minimize fluid retention in your lower extremities, which might reduce nighttime urination.  Pelvic Floor Exercises: Strengthening your pelvic floor muscles through Kegel exercises can help improve bladder control and potentially reduce the urge to urinate at night.  Create a Relaxing Bedtime Routine: Stress and anxiety can exacerbate nocturia. Engage in calming activities before bed, such as reading, listening to soothing music, or practicing relaxation techniques.  Stay Active: Engage in regular physical activity, but avoid intense exercise close to bedtime, as this can increase your body's demand for fluids.  Maintain a Healthy Weight: Excess weight can compress the bladder and contribute to bladder and urinary issues. Aim to achieve and maintain a healthy weight through a balanced  diet and regular exercise.  Remember that every individual is unique, and the effectiveness of these strategies may vary. It's important to work with your healthcare provider to develop a plan that suits your specific needs and addresses any underlying causes of nocturia.

## 2024-02-27 ENCOUNTER — Other Ambulatory Visit: Payer: Self-pay

## 2024-02-27 DIAGNOSIS — N200 Calculus of kidney: Secondary | ICD-10-CM

## 2024-02-27 NOTE — Progress Notes (Unsigned)
ESWL ORDER FORM  Expected date of procedure: Patient preference  Surgeon:  MD on truck  Post op standing: 2-4wk follow up w/KUB prior  Anticoagulation/Aspirin/NSAID standing order: Hold all 72 hours prior  Anesthesia standing order: MAC  VTE standing: SCD's  Dx: Right Nephrolihtiasis  Procedure: right Extracorporeal shock wave lithotripsy  CPT : 30076  Standing Order Set:   *NPO after mn, KUB  *NS 160m/hr, Keflex 5018mPO, Benadryl 2536mO, Valium 78m48m, Zofran 4mg 72m   Medications if other than standing orders:   NONE

## 2024-02-28 ENCOUNTER — Ambulatory Visit (INDEPENDENT_AMBULATORY_CARE_PROVIDER_SITE_OTHER): Admitting: Internal Medicine

## 2024-02-28 ENCOUNTER — Encounter: Payer: Self-pay | Admitting: Internal Medicine

## 2024-02-28 VITALS — BP 116/74 | HR 76 | Ht 74.0 in | Wt 175.8 lb

## 2024-02-28 DIAGNOSIS — G2581 Restless legs syndrome: Secondary | ICD-10-CM

## 2024-02-28 DIAGNOSIS — G4701 Insomnia due to medical condition: Secondary | ICD-10-CM

## 2024-02-28 DIAGNOSIS — R5383 Other fatigue: Secondary | ICD-10-CM | POA: Diagnosis not present

## 2024-02-28 DIAGNOSIS — I10 Essential (primary) hypertension: Secondary | ICD-10-CM | POA: Diagnosis not present

## 2024-02-28 DIAGNOSIS — N200 Calculus of kidney: Secondary | ICD-10-CM | POA: Diagnosis not present

## 2024-02-28 DIAGNOSIS — R918 Other nonspecific abnormal finding of lung field: Secondary | ICD-10-CM

## 2024-02-28 LAB — COMPREHENSIVE METABOLIC PANEL WITH GFR
ALT: 15 U/L (ref 0–53)
AST: 18 U/L (ref 0–37)
Albumin: 4.4 g/dL (ref 3.5–5.2)
Alkaline Phosphatase: 78 U/L (ref 39–117)
BUN: 13 mg/dL (ref 6–23)
CO2: 27 meq/L (ref 19–32)
Calcium: 9.5 mg/dL (ref 8.4–10.5)
Chloride: 103 meq/L (ref 96–112)
Creatinine, Ser: 0.88 mg/dL (ref 0.40–1.50)
GFR: 89.14 mL/min (ref >60.00)
Glucose, Bld: 73 mg/dL (ref 70–99)
Potassium: 4.4 meq/L (ref 3.5–5.1)
Sodium: 136 meq/L (ref 135–145)
Total Bilirubin: 0.8 mg/dL (ref 0.2–1.2)
Total Protein: 6.8 g/dL (ref 6.0–8.3)

## 2024-02-28 LAB — CBC WITH DIFFERENTIAL/PLATELET
Basophils Absolute: 0.1 K/uL (ref 0.0–0.1)
Basophils Relative: 1.8 % (ref 0.0–3.0)
Eosinophils Absolute: 0.1 K/uL (ref 0.0–0.7)
Eosinophils Relative: 2.3 % (ref 0.0–5.0)
HCT: 47 % (ref 39.0–52.0)
Hemoglobin: 15.9 g/dL (ref 13.0–17.0)
Lymphocytes Relative: 23.7 % (ref 12.0–46.0)
Lymphs Abs: 1.2 K/uL (ref 0.7–4.0)
MCHC: 33.8 g/dL (ref 30.0–36.0)
MCV: 94.6 fl (ref 78.0–100.0)
Monocytes Absolute: 0.5 K/uL (ref 0.1–1.0)
Monocytes Relative: 9.7 % (ref 3.0–12.0)
Neutro Abs: 3.3 K/uL (ref 1.4–7.7)
Neutrophils Relative %: 62.5 % (ref 43.0–77.0)
Platelets: 224 K/uL (ref 150.0–400.0)
RBC: 4.98 Mil/uL (ref 4.22–5.81)
RDW: 13.2 % (ref 11.5–15.5)
WBC: 5.2 K/uL (ref 4.0–10.5)

## 2024-02-28 LAB — MICROALBUMIN / CREATININE URINE RATIO
Creatinine,U: 67.5 mg/dL
Microalb Creat Ratio: 282.4 mg/g — ABNORMAL HIGH (ref 0.0–30.0)
Microalb, Ur: 19.1 mg/dL — ABNORMAL HIGH (ref 0.0–1.9)

## 2024-02-28 LAB — TSH: TSH: 1.29 u[IU]/mL (ref 0.35–5.50)

## 2024-02-28 MED ORDER — ATORVASTATIN CALCIUM 20 MG PO TABS: 20.0000 mg | ORAL_TABLET | Freq: Every day | ORAL | 3 refills | Status: AC

## 2024-02-28 MED ORDER — TRAZODONE HCL 50 MG PO TABS: 25.0000 mg | ORAL_TABLET | Freq: Every evening | ORAL | 3 refills | Status: DC | PRN

## 2024-02-28 MED ORDER — TELMISARTAN 20 MG PO TABS: 20.0000 mg | ORAL_TABLET | Freq: Every day | ORAL | 3 refills | Status: AC

## 2024-02-28 NOTE — Progress Notes (Unsigned)
 Subjective:  Patient ID: Bobby Mills, male    DOB: 10-15-1955  Age: 68 y.o. MRN: [id number]  CC: The primary encounter diagnosis was Fatigue, unspecified type. Diagnoses of Essential hypertension, Multiple pulmonary nodules determined by computed tomography of lung, Nephrolithiasis, and Insomnia due to restless legs syndrome were also pertinent to this visit.   HPI Bobby Mills presents for  Chief Complaint  Patient presents with   discuss CT scan results    1) h/o RUL pulmonary nodule , was referred to pulmonology  had a repeat CT scan recently , which noted several  new nodules in both lobes,  as well as a large stone in the right renal pelvis which may be intermittenty obstructive  seen follow up 1 year advised  2) renal stone : lithotripsy planned next week and flomax  to be given afterward   3) colonoscopy in process of scheduling  with [facility] .  Has a history of bradycardia with sedation  occurred colonoscopy   4) anxiety,  poor sleep:  frequent waking.  Compicated by restless legs and frequent urination.   5) constipation :    using BFL daily ,  prune juice . Uses  MOM if all else fails   6) HTN:  using .  Blood pressure is very well controlled      Outpatient Medications Prior to Visit  Medication Sig Dispense Refill   ALPRAZolam  (XANAX ) 0.25 MG tablet TAKE 1/2 TABLET BY MOUTH DAILY AS NEEDEDFOR ANXIETY OR SLEEP 20 tablet 5   minoxidil  (LONITEN ) 2.5 MG tablet Take 0.5 tablets (1.25 mg total) by mouth daily. 30 tablet 3   triamcinolone (KENALOG) 0.1 % Apply topically 2 (two) times daily.     atorvastatin  (LIPITOR) 20 MG tablet TAKE 1 TABLET BY MOUTH DAILY 90 tablet 3   telmisartan  (MICARDIS ) 20 MG tablet Take 1 tablet (20 mg total) by mouth daily. 90 tablet 1   omeprazole  (PRILOSEC) 20 MG capsule Take 1 capsule (20 mg total) by mouth daily. (Patient not taking: Reported on 02/28/2024) 30 capsule 0   XDEMVY 0.25 % SOLN Apply 1 drop to eye 2 (two) times daily.      No facility-administered medications prior to visit.    Review of Systems;  Patient denies headache, fevers, malaise, unintentional weight loss, skin rash, eye pain, sinus congestion and sinus pain, sore throat, dysphagia,  hemoptysis , cough, dyspnea, wheezing, chest pain, palpitations, orthopnea, edema, abdominal pain, nausea, melena, diarrhea, constipation, flank pain, dysuria, hematuria, urinary  Frequency, nocturia, numbness, tingling, seizures,  Focal weakness, Loss of consciousness,  Tremor, insomnia, depression, anxiety, and suicidal ideation.      Objective:  BP 116/74   Pulse 76   Ht 6' 2 (1.88 m)   Wt 175 lb 12.8 oz (79.7 kg)   SpO2 98%   BMI 22.57 kg/m   BP Readings from Last 3 Encounters:  02/28/24 116/74  02/26/24 128/79  11/08/23 104/64    Wt Readings from Last 3 Encounters:  02/28/24 175 lb 12.8 oz (79.7 kg)  02/26/24 180 lb (81.6 kg)  11/08/23 178 lb (80.7 kg)    Physical Exam Vitals reviewed.  Constitutional:      General: He is not in acute distress.    Appearance: Normal appearance. He is normal weight. He is not ill-appearing, toxic-appearing or diaphoretic.  HENT:     Head: Normocephalic.  Eyes:     General: No scleral icterus.       Right eye: No discharge.  Left eye: No discharge.     Conjunctiva/sclera: Conjunctivae normal.  Cardiovascular:     Rate and Rhythm: Normal rate and regular rhythm.     Heart sounds: Normal heart sounds.  Pulmonary:     Effort: Pulmonary effort is normal. No respiratory distress.     Breath sounds: Normal breath sounds.  Musculoskeletal:        General: Normal range of motion.     Cervical back: Normal range of motion.  Skin:    General: Skin is warm and dry.  Neurological:     General: No focal deficit present.     Mental Status: He is alert and oriented to person, place, and time. Mental status is at baseline.  Psychiatric:        Mood and Affect: Mood normal.        Behavior: Behavior normal.         Thought Content: Thought content normal.        Judgment: Judgment normal.     Lab Results  Component Value Date   HGBA1C 5.3 09/29/2022    Lab Results  Component Value Date   CREATININE 0.88 02/28/2024   CREATININE 0.88 10/15/2023   CREATININE 0.96 07/24/2023    Lab Results  Component Value Date   WBC 5.2 02/28/2024   HGB 15.9 02/28/2024   HCT 47.0 02/28/2024   PLT 224.0 02/28/2024   GLUCOSE 73 02/28/2024   CHOL 113 10/15/2023   TRIG 95.0 10/15/2023   HDL 41.60 10/15/2023   LDLDIRECT 58.0 10/15/2023   LDLCALC 52 10/15/2023   ALT 15 02/28/2024   AST 18 02/28/2024   NA 136 02/28/2024   K 4.4 02/28/2024   CL 103 02/28/2024   CREATININE 0.88 02/28/2024   BUN 13 02/28/2024   CO2 27 02/28/2024   TSH 1.29 02/28/2024   PSA 0.36 10/15/2023   HGBA1C 5.3 09/29/2022   MICROALBUR 19.1 (H) 02/28/2024    CT CHEST WO CONTRAST Result Date: 02/06/2024 CLINICAL DATA:  Follow-up for lung nodule on prior imaging, re-evaluate urolithiasis, follow-up exam currently asymptomatic. EXAM: CT CHEST, ABDOMEN AND PELVIS WITHOUT CONTRAST TECHNIQUE: Multidetector CT imaging of the chest, abdomen and pelvis was performed following the standard protocol without IV contrast. RADIATION DOSE REDUCTION: This exam was performed according to the departmental dose-optimization program which includes automated exposure control, adjustment of the mA and/or kV according to patient size and/or use of iterative reconstruction technique. COMPARISON:  Chest CT without contrast 01/18/2023, cardiac partial chest CT 01/16/2022. No prior abdomen and pelvis CT. FINDINGS: CT CHEST FINDINGS Cardiovascular: There are single-vessel calcific plaques in the LAD coronary artery. No pericardial effusion. The heart is normal in size. Pulmonary arteries and veins are normal caliber. The aorta is tortuous. There is no aortic aneurysm. No visible calcific plaques in the thoracic aorta and great vessels. Mediastinum/Nodes: No  enlarged mediastinal, hilar, or axillary lymph nodes. Thyroid  gland, trachea, and esophagus demonstrate no significant findings. There is a small hiatal hernia. Lungs/Pleura: There is a new subsolid right upper lobe nodule posteriorly measuring 10 mm in total, the solid component 5 mm on 3:42. This could be inflammatory but requires follow-up. Chronic reticulated scarring at the bilateral lung apices is noted as well as chronic scarring and bronchiolectasis in the medial right middle lobe base. Previously there was a 3 mm right upper lobe nodule, vicinity of axial images 3: 47-59, which has cleared. There previously was a 3 mm right upper lobe nodule at the level of the carina  posterolaterally which has resolved. There is a new faint ground-glass nodule measuring 1 x 0.9 cm on 3:69. There is a new 3 mm right upper lobe nodule posteriorly on 3:80. The newly noted 2 mm right lower lobe nodule, vicinity of 3:142-151, is no longer seen. Previously noted 3 mm left upper lobe apical nodule has also cleared. There are additional scattered linear scar-like opacities in both bases without further appreciable nodules. There is no pleural effusion, thickening or pneumothorax. The lungs are otherwise clear. Musculoskeletal: There is osteopenia, dextroscoliosis and degenerative change of the spine. No acute or other significant osseous findings. Unremarkable chest wall. CT ABDOMEN PELVIS FINDINGS Hepatobiliary: There is a 1 cm cyst in the dome of segment 7, Hounsfield density is 5. There is a 2.5 cm cyst laterally in segment 8, Hounsfield density is 15. There is a 1.2 cm cyst in segment 6, Hounsfield density is 13. There are occasional scattered liver parenchymal hypodensities which are too small to characterize but are probably also cysts. No other focal liver abnormality is seen without contrast. The gallbladder bile ducts are unremarkable. Pancreas: Unremarkable without contrast. Spleen: Unremarkable without contrast.  No  splenomegaly. Adrenals/Urinary Tract: There is no adrenal mass. There is no contour deforming abnormality of the unenhanced kidneys. On the left, is a 5 mm nonobstructive caliceal stone in the superior pole, and there are parapelvic cysts. Largest of the cysts in the upper pole measuring 2.8 cm, 10 Hounsfield units. There is no left hydronephrosis or ureteral stone. On the right, there is a 1.3 x 0.7 cm oval stone sitting in the renal pelvis. This could be intermittently obstructing. There is mild right hydronephrosis. There is urothelial thickening with adjacent stranding involving the right renal pelvis which could be infectious or could be inflammatory related to the presence of the stone. There are occasional additional punctate caliceal stones in the right kidney with no stones in the right ureter. The bladder unremarkable for the degree of distension. Stomach/Bowel: No dilatation or wall thickening. Mobile cecum. Normal appendix. Moderate fecal stasis ascending and transverse colon. Sigmoid diverticulosis is seen without diverticulitis. Vascular/Lymphatic: Aortic atherosclerosis. No enlarged abdominal or pelvic lymph nodes. Reproductive: Enlarged prostate, 5.1 cm transverse. Other: None. Musculoskeletal: There is moderate lumbar levoscoliosis. Degenerative change lumbar spine. Mild hip DJD. The unilateral left pars defect L5. No acute or other significant osseous findings or destructive lesions. IMPRESSION: 1. 1.3 x 0.7 cm oval stone sitting in the right renal pelvis, could be intermittently obstructing. There is mild right hydronephrosis. 2. Urothelial thickening with adjacent stranding involving the right renal pelvis which could be infectious, or inflammatory related to the presence of the stone. 3. Additional nonobstructive nephrolithiasis. 4. Left renal parapelvic cysts. 5. Constipation and diverticulosis. 6. Prostatomegaly. 7. Aortic and coronary artery atherosclerosis. 8. Small hiatal hernia. 9.  Multiple pulmonary nodules. Most significant: Right part-solid pulmonary nodule within the upper lobe measuring 10 mm with 5 mm solid component. Follow-up non-contrast CT recommended at 3-6 months to confirm persistence. If unchanged, and solid component remains <6 mm, annual CT is recommended until 5 years of stability has been established. If persistent these nodules should be considered highly suspicious if the solid component of the nodule is 6 mm or greater in size and enlarging. This recommendation follows the consensus statement: Guidelines for Management of Incidental Pulmonary Nodules Detected on CT Images: From the Fleischner Society 2017; Radiology 2017; 284:228-243. Aortic Atherosclerosis (ICD10-I70.0). Electronically Signed   By: Francis Quam M.D.   On: 02/06/2024 03:20  CT RENAL STONE STUDY Result Date: 02/06/2024 CLINICAL DATA:  Follow-up for lung nodule on prior imaging, re-evaluate urolithiasis, follow-up exam currently asymptomatic. EXAM: CT CHEST, ABDOMEN AND PELVIS WITHOUT CONTRAST TECHNIQUE: Multidetector CT imaging of the chest, abdomen and pelvis was performed following the standard protocol without IV contrast. RADIATION DOSE REDUCTION: This exam was performed according to the departmental dose-optimization program which includes automated exposure control, adjustment of the mA and/or kV according to patient size and/or use of iterative reconstruction technique. COMPARISON:  Chest CT without contrast 01/18/2023, cardiac partial chest CT 01/16/2022. No prior abdomen and pelvis CT. FINDINGS: CT CHEST FINDINGS Cardiovascular: There are single-vessel calcific plaques in the LAD coronary artery. No pericardial effusion. The heart is normal in size. Pulmonary arteries and veins are normal caliber. The aorta is tortuous. There is no aortic aneurysm. No visible calcific plaques in the thoracic aorta and great vessels. Mediastinum/Nodes: No enlarged mediastinal, hilar, or axillary lymph nodes.  Thyroid  gland, trachea, and esophagus demonstrate no significant findings. There is a small hiatal hernia. Lungs/Pleura: There is a new subsolid right upper lobe nodule posteriorly measuring 10 mm in total, the solid component 5 mm on 3:42. This could be inflammatory but requires follow-up. Chronic reticulated scarring at the bilateral lung apices is noted as well as chronic scarring and bronchiolectasis in the medial right middle lobe base. Previously there was a 3 mm right upper lobe nodule, vicinity of axial images 3: 47-59, which has cleared. There previously was a 3 mm right upper lobe nodule at the level of the carina posterolaterally which has resolved. There is a new faint ground-glass nodule measuring 1 x 0.9 cm on 3:69. There is a new 3 mm right upper lobe nodule posteriorly on 3:80. The newly noted 2 mm right lower lobe nodule, vicinity of 3:142-151, is no longer seen. Previously noted 3 mm left upper lobe apical nodule has also cleared. There are additional scattered linear scar-like opacities in both bases without further appreciable nodules. There is no pleural effusion, thickening or pneumothorax. The lungs are otherwise clear. Musculoskeletal: There is osteopenia, dextroscoliosis and degenerative change of the spine. No acute or other significant osseous findings. Unremarkable chest wall. CT ABDOMEN PELVIS FINDINGS Hepatobiliary: There is a 1 cm cyst in the dome of segment 7, Hounsfield density is 5. There is a 2.5 cm cyst laterally in segment 8, Hounsfield density is 15. There is a 1.2 cm cyst in segment 6, Hounsfield density is 13. There are occasional scattered liver parenchymal hypodensities which are too small to characterize but are probably also cysts. No other focal liver abnormality is seen without contrast. The gallbladder bile ducts are unremarkable. Pancreas: Unremarkable without contrast. Spleen: Unremarkable without contrast.  No splenomegaly. Adrenals/Urinary Tract: There is no adrenal  mass. There is no contour deforming abnormality of the unenhanced kidneys. On the left, is a 5 mm nonobstructive caliceal stone in the superior pole, and there are parapelvic cysts. Largest of the cysts in the upper pole measuring 2.8 cm, 10 Hounsfield units. There is no left hydronephrosis or ureteral stone. On the right, there is a 1.3 x 0.7 cm oval stone sitting in the renal pelvis. This could be intermittently obstructing. There is mild right hydronephrosis. There is urothelial thickening with adjacent stranding involving the right renal pelvis which could be infectious or could be inflammatory related to the presence of the stone. There are occasional additional punctate caliceal stones in the right kidney with no stones in the right ureter. The bladder unremarkable  for the degree of distension. Stomach/Bowel: No dilatation or wall thickening. Mobile cecum. Normal appendix. Moderate fecal stasis ascending and transverse colon. Sigmoid diverticulosis is seen without diverticulitis. Vascular/Lymphatic: Aortic atherosclerosis. No enlarged abdominal or pelvic lymph nodes. Reproductive: Enlarged prostate, 5.1 cm transverse. Other: None. Musculoskeletal: There is moderate lumbar levoscoliosis. Degenerative change lumbar spine. Mild hip DJD. The unilateral left pars defect L5. No acute or other significant osseous findings or destructive lesions. IMPRESSION: 1. 1.3 x 0.7 cm oval stone sitting in the right renal pelvis, could be intermittently obstructing. There is mild right hydronephrosis. 2. Urothelial thickening with adjacent stranding involving the right renal pelvis which could be infectious, or inflammatory related to the presence of the stone. 3. Additional nonobstructive nephrolithiasis. 4. Left renal parapelvic cysts. 5. Constipation and diverticulosis. 6. Prostatomegaly. 7. Aortic and coronary artery atherosclerosis. 8. Small hiatal hernia. 9. Multiple pulmonary nodules. Most significant: Right part-solid  pulmonary nodule within the upper lobe measuring 10 mm with 5 mm solid component. Follow-up non-contrast CT recommended at 3-6 months to confirm persistence. If unchanged, and solid component remains <6 mm, annual CT is recommended until 5 years of stability has been established. If persistent these nodules should be considered highly suspicious if the solid component of the nodule is 6 mm or greater in size and enlarging. This recommendation follows the consensus statement: Guidelines for Management of Incidental Pulmonary Nodules Detected on CT Images: From the Fleischner Society 2017; Radiology 2017; 284:228-243. Aortic Atherosclerosis (ICD10-I70.0). Electronically Signed   By: Francis Quam M.D.   On: 02/06/2024 03:20    Assessment & Plan:  .Fatigue, unspecified type -     TSH -     CBC with Differential/Platelet  Essential hypertension Assessment & Plan: Currently managed with telmisartan  only for  new onset microalbuminuria.  Advised to suspend during use of Flomax  to avoid hypotension  Lab Results  Component Value Date   LABMICR See below: 02/16/2022   MICROALBUR 19.1 (H) 02/28/2024       Orders: -     Comprehensive metabolic panel with GFR -     Microalbumin / creatinine urine ratio  Multiple pulmonary nodules determined by computed tomography of lung Assessment & Plan: Continue annual surveillance    Nephrolithiasis  Insomnia due to restless legs syndrome Assessment & Plan: He has deferred start of Requip .  New onset anxiety  Using trazodone ,  alprazolam  prn .     Other orders -     Atorvastatin  Calcium ; Take 1 tablet (20 mg total) by mouth daily.  Dispense: 90 tablet; Refill: 3 -     Telmisartan ; Take 1 tablet (20 mg total) by mouth daily.  Dispense: 90 tablet; Refill: 3 -     traZODone  HCl; Take 0.5-1 tablets (25-50 mg total) by mouth at bedtime as needed for sleep.  Dispense: 30 tablet; Refill: 3    Follow-up: Return in about 3 months (around  05/29/2024).   Verneita LITTIE Kettering, MD

## 2024-02-28 NOTE — Patient Instructions (Addendum)
 Your blood pressure is so well controlled that I recommend you suspend the telmisartan  while you are taking flomax  to avoid HYPOTENSION caused by concurrent use of minoxidil   You can resume  it after stopping flomax  , or if Your BP jumps  to 140/90 while on flomax     Ok to use 1/2 tablet of  alprazolam  once daily if needed at bedtime   I recommend a trial of trazodone  for NIGHTLY USE.  Start trazodone  at 25 mg ; TAKE IT ONE HOUR  before bedtime .  If not sleepy at bedtime, you may add the 1/2 tablet of alprazolam    increase trazodone  dose to 50 mg after a few days if still needing nightly alprazolam 

## 2024-02-28 NOTE — Progress Notes (Signed)
    [facility] ESWL POSTING SHEET        Patient Name: Bobby Mills  DOB: February 02, 1956  MRN: [id number]  Surgeon:  Glendia Barba, MD  Diagnosis:  Right Nephrolihtiasis  CPT: 49409  ESWL DATE: 03/06/2024  ESWL TIME: 0845  Special Needs/Requirements: None       Cardiac/Medical/Pulmonary Clearance needed: No       Form Faxed to [facility]- [phone] Date:   Date: 02/28/24       Form Faxed to [place]- [phone]  Date:  Date: 02/28/24           Copy Made for Insurance PA:  Date: 02/28/24       Orders Entered in to Epic:  Date: 02/28/24

## 2024-03-01 NOTE — Assessment & Plan Note (Signed)
 Currently managed with telmisartan  only for  new onset microalbuminuria.  Advised to suspend during use of Flomax  to avoid hypotension  Lab Results  Component Value Date   LABMICR See below: 02/16/2022   MICROALBUR 19.1 (H) 02/28/2024

## 2024-03-01 NOTE — Assessment & Plan Note (Signed)
 He has deferred start of Requip .  New onset anxiety  Using trazodone ,  alprazolam  prn .

## 2024-03-01 NOTE — Assessment & Plan Note (Signed)
Continue annual surveillance.

## 2024-03-02 ENCOUNTER — Ambulatory Visit: Payer: Self-pay | Admitting: Internal Medicine

## 2024-03-02 DIAGNOSIS — R809 Proteinuria, unspecified: Secondary | ICD-10-CM | POA: Insufficient documentation

## 2024-03-02 NOTE — Assessment & Plan Note (Signed)
 uaCr has increased to nearly 300 over the last 6 months despite rx of telsmisartan and well controlled hypertension.  May be related to kidney stone.   Referring to nephrology

## 2024-03-04 ENCOUNTER — Other Ambulatory Visit: Payer: Self-pay | Admitting: Internal Medicine

## 2024-03-04 DIAGNOSIS — R809 Proteinuria, unspecified: Secondary | ICD-10-CM

## 2024-03-06 ENCOUNTER — Ambulatory Visit

## 2024-03-06 ENCOUNTER — Other Ambulatory Visit: Payer: Self-pay

## 2024-03-06 ENCOUNTER — Ambulatory Visit
Admission: RE | Admit: 2024-03-06 | Discharge: 2024-03-06 | Disposition: A | Discharge status: Home / Self Care | Attending: Urology | Admitting: Urology

## 2024-03-06 ENCOUNTER — Encounter: Payer: Self-pay | Admitting: Urology

## 2024-03-06 ENCOUNTER — Encounter
Admission: RE | Disposition: A | Discharge status: Home / Self Care | Payer: Self-pay | Source: Home / Self Care | Attending: Urology

## 2024-03-06 DIAGNOSIS — N200 Calculus of kidney: Secondary | ICD-10-CM | POA: Diagnosis not present

## 2024-03-06 DIAGNOSIS — N132 Hydronephrosis with renal and ureteral calculous obstruction: Secondary | ICD-10-CM | POA: Diagnosis not present

## 2024-03-06 DIAGNOSIS — I10 Essential (primary) hypertension: Secondary | ICD-10-CM | POA: Diagnosis not present

## 2024-03-06 HISTORY — PX: EXTRACORPOREAL SHOCK WAVE LITHOTRIPSY: SHX1557

## 2024-03-06 SURGERY — LITHOTRIPSY, ESWL
Anesthesia: Moderate Sedation | Laterality: Right

## 2024-03-06 MED ORDER — DIPHENHYDRAMINE HCL 25 MG PO CAPS
25.0000 mg | ORAL_CAPSULE | ORAL | Status: AC
  Administered 2024-03-06: 25 mg via ORAL

## 2024-03-06 MED ORDER — DIAZEPAM 5 MG PO TABS
ORAL_TABLET | ORAL | Status: AC
  Filled 2024-03-06: qty 2

## 2024-03-06 MED ORDER — ONDANSETRON HCL 4 MG/2ML IJ SOLN
INTRAMUSCULAR | Status: AC
  Filled 2024-03-06: qty 2

## 2024-03-06 MED ORDER — OXYCODONE HCL 5 MG PO TABS: 5.0000 mg | ORAL_TABLET | Freq: Four times a day (QID) | ORAL | 0 refills | Status: DC | PRN

## 2024-03-06 MED ORDER — DIPHENHYDRAMINE HCL 25 MG PO CAPS
ORAL_CAPSULE | ORAL | Status: AC
  Filled 2024-03-06: qty 1

## 2024-03-06 MED ORDER — TAMSULOSIN HCL 0.4 MG PO CAPS
0.4000 mg | ORAL_CAPSULE | Freq: Every day | ORAL | 1 refills | Status: DC
Start: 2024-03-06 — End: 2024-03-27

## 2024-03-06 MED ORDER — CEPHALEXIN 500 MG PO CAPS
500.0000 mg | ORAL_CAPSULE | Freq: Once | ORAL | Status: AC
  Administered 2024-03-06: 500 mg via ORAL

## 2024-03-06 MED ORDER — SODIUM CHLORIDE 0.9 % IV SOLN: INTRAVENOUS | Status: DC

## 2024-03-06 MED ORDER — DIAZEPAM 5 MG PO TABS
10.0000 mg | ORAL_TABLET | ORAL | Status: AC
  Administered 2024-03-06: 10 mg via ORAL

## 2024-03-06 MED ORDER — ONDANSETRON 4 MG PO TBDP: 4.0000 mg | ORAL_TABLET | Freq: Three times a day (TID) | ORAL | 0 refills | Status: DC | PRN

## 2024-03-06 MED ORDER — CEPHALEXIN 500 MG PO CAPS
ORAL_CAPSULE | ORAL | Status: AC
  Filled 2024-03-06: qty 1

## 2024-03-06 MED ORDER — ONDANSETRON HCL 4 MG/2ML IJ SOLN
4.0000 mg | Freq: Once | INTRAMUSCULAR | Status: AC
  Administered 2024-03-06: 4 mg via INTRAVENOUS

## 2024-03-06 NOTE — Discharge Instructions (Addendum)
 As per the [facility] discharge instructions A prescription for pain and nausea medication was sent to your pharmacy A prescription for tamsulosin  which will help you pass stone fragments was also sent to your pharmacy Call [facility] at [phone] for pain not controlled with oral medications or fever greater than 101 degrees You will be contacted for a follow-up appointment

## 2024-03-06 NOTE — Interval H&P Note (Signed)
 History and Physical Interval Note:  03/06/2024 3:31 PM  Bobby Mills  has presented today for surgery, with the diagnosis of Right Nephrolithiasis.  The various methods of treatment have been discussed with the patient and family. After consideration of risks, benefits and other options for treatment, the patient has consented to  Procedure(s): LITHOTRIPSY, ESWL (Right) as a surgical intervention.  The patient's history has been reviewed, patient examined, no change in status, stable for surgery.  I have reviewed the patient's chart and labs.  Questions were answered to the patient's satisfaction.    CV:RRR LUNGS:clear  [name]

## 2024-03-07 ENCOUNTER — Encounter: Payer: Self-pay | Admitting: Urology

## 2024-03-07 ENCOUNTER — Telehealth: Payer: Self-pay

## 2024-03-07 ENCOUNTER — Other Ambulatory Visit: Payer: Self-pay

## 2024-03-07 DIAGNOSIS — N200 Calculus of kidney: Secondary | ICD-10-CM

## 2024-03-07 NOTE — Telephone Encounter (Signed)
 Pt called in with complaints of pain after having his Lithotripsy yesterday. He woke up at 2 am in pain and tylenol has not help. I was able to see that he was prescribed Oxycodone  for severe pain, flomax  and zofran . I informed patient to reach out to his pharmacy and pick up those meds and instructed him on how to take them and other strategies for pain relief like using a heating pad. Pt voiced understanding.

## 2024-03-10 ENCOUNTER — Telehealth: Payer: Self-pay

## 2024-03-10 NOTE — Telephone Encounter (Signed)
 Pt called with c/o of what is sounding like constipation pain. Pt had small bowel movement this morning with a little relief. Pt had not went for 5 days prior after having a lithotripsy last week. Pt is currently taking miralax  and I suggested taking over the counter stool softner to help get him back regular. Pt did mention he has not seen any stone fragments yet since procedure but states tylenol and ibuprofen is helping when he does have pain. Pt has follow up soon with a PA to verify stones were taken care of in Lithotripsy.

## 2024-03-17 DIAGNOSIS — Z85828 Personal history of other malignant neoplasm of skin: Secondary | ICD-10-CM | POA: Diagnosis not present

## 2024-03-17 DIAGNOSIS — D2262 Melanocytic nevi of left upper limb, including shoulder: Secondary | ICD-10-CM | POA: Diagnosis not present

## 2024-03-17 DIAGNOSIS — D2261 Melanocytic nevi of right upper limb, including shoulder: Secondary | ICD-10-CM | POA: Diagnosis not present

## 2024-03-17 DIAGNOSIS — L570 Actinic keratosis: Secondary | ICD-10-CM | POA: Diagnosis not present

## 2024-03-17 DIAGNOSIS — D2271 Melanocytic nevi of right lower limb, including hip: Secondary | ICD-10-CM | POA: Diagnosis not present

## 2024-03-17 DIAGNOSIS — D0472 Carcinoma in situ of skin of left lower limb, including hip: Secondary | ICD-10-CM | POA: Diagnosis not present

## 2024-03-17 DIAGNOSIS — D2272 Melanocytic nevi of left lower limb, including hip: Secondary | ICD-10-CM | POA: Diagnosis not present

## 2024-03-17 DIAGNOSIS — D485 Neoplasm of uncertain behavior of skin: Secondary | ICD-10-CM | POA: Diagnosis not present

## 2024-03-18 DIAGNOSIS — R8290 Unspecified abnormal findings in urine: Secondary | ICD-10-CM | POA: Diagnosis not present

## 2024-03-18 DIAGNOSIS — N200 Calculus of kidney: Secondary | ICD-10-CM | POA: Diagnosis not present

## 2024-03-18 DIAGNOSIS — I10 Essential (primary) hypertension: Secondary | ICD-10-CM | POA: Diagnosis not present

## 2024-03-18 DIAGNOSIS — R809 Proteinuria, unspecified: Secondary | ICD-10-CM | POA: Diagnosis not present

## 2024-03-25 DIAGNOSIS — Z860101 Personal history of adenomatous and serrated colon polyps: Secondary | ICD-10-CM | POA: Diagnosis not present

## 2024-03-25 DIAGNOSIS — K5909 Other constipation: Secondary | ICD-10-CM | POA: Diagnosis not present

## 2024-03-25 DIAGNOSIS — Z800 Family history of malignant neoplasm of digestive organs: Secondary | ICD-10-CM | POA: Diagnosis not present

## 2024-03-27 ENCOUNTER — Encounter: Payer: Self-pay | Admitting: Physician Assistant

## 2024-03-27 ENCOUNTER — Ambulatory Visit: Admitting: Internal Medicine

## 2024-03-27 ENCOUNTER — Ambulatory Visit
Admission: RE | Admit: 2024-03-27 | Discharge: 2024-03-27 | Disposition: A | Discharge status: Home / Self Care | Source: Ambulatory Visit | Attending: Urology | Admitting: Urology

## 2024-03-27 ENCOUNTER — Ambulatory Visit (INDEPENDENT_AMBULATORY_CARE_PROVIDER_SITE_OTHER): Admitting: Physician Assistant

## 2024-03-27 ENCOUNTER — Ambulatory Visit
Admission: RE | Admit: 2024-03-27 | Discharge: 2024-03-27 | Disposition: A | Source: Ambulatory Visit | Attending: Urology | Admitting: Urology

## 2024-03-27 VITALS — BP 120/78 | HR 58 | Ht 75.0 in | Wt 171.0 lb

## 2024-03-27 DIAGNOSIS — R351 Nocturia: Secondary | ICD-10-CM

## 2024-03-27 DIAGNOSIS — N2889 Other specified disorders of kidney and ureter: Secondary | ICD-10-CM | POA: Diagnosis not present

## 2024-03-27 DIAGNOSIS — N200 Calculus of kidney: Secondary | ICD-10-CM

## 2024-03-27 LAB — MICROSCOPIC EXAMINATION

## 2024-03-27 LAB — URINALYSIS, COMPLETE
Bilirubin, UA: NEGATIVE
Glucose, UA: NEGATIVE
Ketones, UA: NEGATIVE
Leukocytes,UA: NEGATIVE
Nitrite, UA: NEGATIVE
Protein,UA: NEGATIVE
RBC, UA: NEGATIVE
Specific Gravity, UA: 1.02 (ref 1.005–1.030)
Urobilinogen, Ur: 0.2 mg/dL (ref 0.2–1.0)
pH, UA: 6 (ref 5.0–7.5)

## 2024-03-27 NOTE — Progress Notes (Signed)
 03/27/2024 10:56 AM   Bobby Mills 18-Oct-1955 [id number]  CC: Chief Complaint  Patient presents with   Nephrolithiasis   HPI: Bobby Mills is a 68 y.o. male with PMH nephrolithiasis and LUTS who underwent ESWL with Dr. Twylla on 03/06/2024 for management of a 1.3 cm right renal pelvis stone who presents today for follow up. Operative note describes fragmentation of the stone.   Today he reports he started passing fragments about 5 days after his procedure, and that lasted about 5 days.  He is feeling well with no acute concerns or pain today.  He captured innumerable stone fragments, which he brings with him today for analysis.  KUB today with interval clearance of the right renal stone.  He has a stable 5 mm left upper pole stone.  He wonders if he can stop Flomax .  Dr. Francisca put him on it to see if it would help with his nocturia.  He has not noticed much difference, though he is tolerating it well without orthostasis.  He admits that his nocturia can be variable between x 1 and x 3-4.  In-office UA and microscopy pan negative.  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 If you have any questions, ask your nurse or doctor.          STOP taking these medications    ondansetron  4 MG disintegrating tablet Commonly known as: ZOFRAN -ODT Stopped by: Mansur Patti   oxyCODONE  5 MG immediate release tablet Commonly known as: Oxy IR/ROXICODONE  Stopped by:  Lucie Hones       TAKE these medications    ALPRAZolam  0.25 MG tablet Commonly known as: XANAX  TAKE 1/2 TABLET BY MOUTH DAILY AS NEEDEDFOR ANXIETY OR SLEEP   atorvastatin  20 MG tablet Commonly known as: LIPITOR Take 1 tablet (20 mg total) by mouth daily.   minoxidil  2.5 MG tablet Commonly known as: LONITEN  Take 0.5 tablets (1.25 mg total) by mouth daily.   tamsulosin  0.4 MG Caps capsule Commonly known as: FLOMAX  Take 1 capsule (0.4 mg total) by mouth daily after breakfast.   telmisartan  20 MG tablet Commonly known as: MICARDIS  Take 1 tablet (20 mg total) by mouth daily.   traZODone  50 MG tablet Commonly known as: DESYREL  Take 0.5-1 tablets (25-50 mg total) by mouth at bedtime as needed for sleep.   triamcinolone cream 0.1 % Commonly known as: KENALOG Apply topically 2 (two) times daily.        Allergies:  No Known Allergies  Family History: Family History  Problem Relation Age of Onset   Hearing loss Mother    Hypertension Mother    Hyperlipidemia Mother    Hearing loss Father    Hyperlipidemia Father    Heart disease Father 39       CAD, 7 vessel bypass at age 62    Cancer Father    Heart failure Paternal Grandmother  Heart attack Paternal Grandfather    Heart attack Maternal Uncle     Social History:   reports that he quit smoking about 16 years ago. His smoking use included cigarettes. He started smoking about 41 years ago. He has a 2.5 pack-year smoking history. He has been exposed to tobacco smoke. He has never used smokeless tobacco. He reports that he does not drink alcohol and does not use drugs.  Physical Exam: BP 120/78   Pulse (!) 58   Ht 6' 3 (1.905 m)   Wt 171 lb (77.6 kg)   BMI 21.37 kg/m   Constitutional:  Alert and oriented, no acute distress, nontoxic appearing HEENT: [place], AT Cardiovascular: No clubbing, cyanosis, or edema Respiratory: Normal respiratory effort, no increased work of breathing Skin: No rashes, bruises  or suspicious lesions Neurologic: Grossly intact, no focal deficits, moving all 4 extremities Psychiatric: Normal mood and affect  Laboratory Data: Results for orders placed or performed in visit on 03/27/24  Microscopic Examination   Collection Time: 03/27/24 11:05 AM   Urine  Result Value Ref Range   WBC, UA 0-5 0 - 5 /hpf   RBC, Urine 0-2 0 - 2 /hpf   Epithelial Cells (non renal) 0-10 0 - 10 /hpf   Bacteria, UA Few None seen/Few  Urinalysis, Complete   Collection Time: 03/27/24 11:05 AM  Result Value Ref Range   Specific Gravity, UA 1.020 1.005 - 1.030   pH, UA 6.0 5.0 - 7.5   Color, UA Yellow Yellow   Appearance Ur Clear Clear   Leukocytes,UA Negative Negative   Protein,UA Negative Negative/Trace   Glucose, UA Negative Negative   Ketones, UA Negative Negative   RBC, UA Negative Negative   Bilirubin, UA Negative Negative   Urobilinogen, Ur 0.2 0.2 - 1.0 mg/dL   Nitrite, UA Negative Negative   Microscopic Examination Comment    Microscopic Examination See below:    Pertinent Imaging: KUB, 03/27/2024: See Epic  I personally reviewed the images referenced above and note interval clearance of the right lower pole stone.  There is a stable left upper pole stone.  Assessment & Plan:   1. Right renal stone (Primary) Interval clearance of the stone on KUB.  UA is bland.  He captured numerous stone fragments, which I will send for analysis today.  Will share stone analysis results and prevention recommendations via MyChart. - Urinalysis, Complete - Stone Analysis  2. Left renal stone Stable 5 mm left upper pole stone.  I recommended surveillance for this for now.  Will repeat KUB in 1 year or sooner if he develops acute left flank pain.  3. Nocturia Variable.  No significant improvement on Flomax .  Will stop this.  We discussed that if he notices worsening voiding symptoms after stopping it, we can easily resume it.  Return in about 1 year (around 03/27/2025) for Annual  IPSS/KUB prior, Will share stone analysis results via MyChart.  Lucie Hones, PA-C  [facility] [place] [address], [address] [place], [place] [address] [phone]

## 2024-04-08 ENCOUNTER — Ambulatory Visit: Payer: Self-pay | Admitting: Physician Assistant

## 2024-04-08 LAB — STONE ANALYSIS
Calcium Oxalate Dihydrate: 10 %
Calcium Oxalate Monohydrate: 90 %
Weight Calculi: 587 mg

## 2024-04-09 ENCOUNTER — Ambulatory Visit: Admitting: Urology

## 2024-04-10 ENCOUNTER — Ambulatory Visit: Payer: Self-pay | Admitting: Urology

## 2024-04-10 ENCOUNTER — Ambulatory Visit: Admitting: Urology

## 2024-04-11 DIAGNOSIS — D0472 Carcinoma in situ of skin of left lower limb, including hip: Secondary | ICD-10-CM | POA: Diagnosis not present

## 2024-04-14 ENCOUNTER — Ambulatory Visit: Payer: Self-pay

## 2024-04-14 DIAGNOSIS — Z860100 Personal history of colon polyps, unspecified: Secondary | ICD-10-CM | POA: Diagnosis not present

## 2024-04-14 DIAGNOSIS — Z860101 Personal history of adenomatous and serrated colon polyps: Secondary | ICD-10-CM | POA: Diagnosis not present

## 2024-04-14 DIAGNOSIS — K5730 Diverticulosis of large intestine without perforation or abscess without bleeding: Secondary | ICD-10-CM | POA: Diagnosis not present

## 2024-04-14 DIAGNOSIS — Z09 Encounter for follow-up examination after completed treatment for conditions other than malignant neoplasm: Secondary | ICD-10-CM | POA: Diagnosis not present

## 2024-04-14 DIAGNOSIS — K640 First degree hemorrhoids: Secondary | ICD-10-CM | POA: Diagnosis not present

## 2024-04-15 ENCOUNTER — Ambulatory Visit: Admitting: Internal Medicine

## 2024-04-16 ENCOUNTER — Encounter: Payer: Self-pay | Admitting: Student in an Organized Health Care Education/Training Program

## 2024-04-16 ENCOUNTER — Ambulatory Visit: Admitting: Student in an Organized Health Care Education/Training Program

## 2024-04-16 VITALS — BP 122/72 | HR 49 | Temp 97.6°F | Ht 75.0 in | Wt 174.0 lb

## 2024-04-16 DIAGNOSIS — R911 Solitary pulmonary nodule: Secondary | ICD-10-CM

## 2024-04-16 DIAGNOSIS — J301 Allergic rhinitis due to pollen: Secondary | ICD-10-CM | POA: Diagnosis not present

## 2024-04-16 NOTE — Progress Notes (Signed)
 Assessment & Plan:   #Pulmonary nodules  Pulmonary nodules identified on CT scans show some resolving and new ones appearing. This is described as ground glass, suggesting an inflammatory process rather than malignancy. A larger nodule on the right appears benign, following an airway, and lacks well-defined contours typical of cancerous nodules. The transient nature suggests a benign process, but continued surveillance is necessary to monitor for growth or persistence. Will schedule a follow-up CT scan in August 2026 to monitor pulmonary nodules. Advise to report any new respiratory symptoms, such as dyspnea or wheezing, promptly.  - CT CHEST WO CONTRAST; Future  #Allergic rhinitis  He reports a morning cough, likely due to allergies, more pronounced in the fall. Oxygen saturation levels remain normal, and lung examination reveals clear lungs without abnormalities. Trial taking Claritin for allergy management.   Return in about 10 months (around 02/14/2025).  Belva November, MD [facility]  I spent 30 minutes caring for this patient today, including preparing to see the patient, obtaining a medical history , reviewing a separately obtained history, performing a medically appropriate examination and/or evaluation, counseling and educating the patient/family/caregiver, ordering medications, tests, or procedures, documenting clinical information in the electronic health record, and independently interpreting results (not separately reported/billed) and communicating results to the patient/family/caregiver  End of visit medications:  No orders of the defined types were placed in this encounter.    Current Outpatient Medications:    ALPRAZolam  (XANAX ) 0.25 MG tablet, TAKE 1/2 TABLET BY MOUTH DAILY AS NEEDEDFOR ANXIETY OR SLEEP, Disp: 20 tablet, Rfl: 5   atorvastatin  (LIPITOR) 20 MG tablet, Take 1 tablet (20 mg total) by mouth daily., Disp: 90 tablet, Rfl: 3    minoxidil  (LONITEN ) 2.5 MG tablet, Take 0.5 tablets (1.25 mg total) by mouth daily., Disp: 30 tablet, Rfl: 3   tamsulosin  (FLOMAX ) 0.4 MG CAPS capsule, Take 0.4 mg by mouth daily., Disp: , Rfl:    telmisartan  (MICARDIS ) 20 MG tablet, Take 1 tablet (20 mg total) by mouth daily., Disp: 90 tablet, Rfl: 3   traZODone  (DESYREL ) 50 MG tablet, Take 0.5-1 tablets (25-50 mg total) by mouth at bedtime as needed for sleep., Disp: 30 tablet, Rfl: 3   triamcinolone (KENALOG) 0.1 %, Apply topically 2 (two) times daily., Disp: , Rfl:    Subjective:   PATIENT ID: Bobby Mills GENDER: male DOB: 1956/05/13, MRN: [id number]  Chief Complaint  Patient presents with   Lung Mass    CT on 8/7. No breathing problems.    HPI  Discussed the use of AI scribe software for clinical note transcription with the patient, who gave verbal consent to proceed.  Bobby Mills is a 68 year old male who presents for follow-up of pulmonary nodules.  Return Visit 11/08/2023:  He remains in his usual state of health and has no symptoms. He's had no shortness of breath, cough, sputum production, or wheeze. No fevers reported.   Patient initially presented to clinic for the evaluation of a pulmonary nodule noted on cardiac CT. The CT was noted to have a 4 millimeter pulmonary nodule for which 1 year follow-up was recommended.  Following our last visit, a repeat CT was ordered which he obtained and he is here to discuss its results.  Return Visit 04/16/2024:  He has had multiple CT scans revealing pulmonary nodules, with some nodules disappearing and new ones appearing, and some described as ground glass. A larger nodule was noted on the right side during the  most recent scan.  Occasional morning cough is attributed to allergies. His mother notes he coughs more than he notices, particularly in the morning, which he believes is due to drainage or allergies. Oxygen saturation levels are typically between 98 and 100%. He has no  other respiratory symptoms, with no shortness of breath, no chest pain, and no chest discomfort. He denies any wheezing.  During a recent abdominal scan, a 1.3 cm stone was discovered, which he had treated, although he was asymptomatic prior to its discovery.     He was originally from [place]  but lived throughout the [place]  for most of his life and moved back here 5 years ago.  He did spend time in [place] [facility] [place] , in [place] .  He has a distant past medical history of smoking with between 2.5 pack years of smoking history.  He quit in 2009.  No other occupational exposures were reported and he mostly worked in [employer].   Ancillary information including prior medications, full medical/surgical/family/social histories, and PFTs (when available) are listed below and have been reviewed.    Review of Systems  Constitutional:  Negative for chills, fever, malaise/fatigue and weight loss.  Respiratory:  Positive for cough. Negative for hemoptysis, sputum production, shortness of breath and wheezing.   Cardiovascular:  Negative for chest pain.     Objective:   Vitals:   04/16/24 1055  BP: 122/72  Pulse: (!) 49  Temp: 97.6 F (36.4 C)  SpO2: 100%  Weight: 174 lb (78.9 kg)  Height: 6' 3 (1.905 m)   100% on RA  BMI Readings from Last 3 Encounters:  04/16/24 21.75 kg/m  03/27/24 21.37 kg/m  02/28/24 22.57 kg/m   Wt Readings from Last 3 Encounters:  04/16/24 174 lb (78.9 kg)  03/27/24 171 lb (77.6 kg)  02/28/24 175 lb 12.8 oz (79.7 kg)    Physical Exam Constitutional:      Appearance: Normal appearance.  Cardiovascular:     Rate and Rhythm: Normal rate and regular rhythm.     Pulses: Normal pulses.     Heart sounds: Normal heart sounds.  Pulmonary:     Effort: Pulmonary effort is normal. No respiratory distress.     Breath sounds: Normal breath sounds. No wheezing or rales.  Neurological:     General: No focal deficit present.     Mental Status: He is alert  and oriented to person, place, and time. Mental status is at baseline.       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, squamous   History of colon polyps    Kidney stone      Family History  Problem Relation Age of Onset   Hearing loss Mother    Hypertension Mother    Hyperlipidemia Mother    Hearing loss Father    Hyperlipidemia Father    Heart disease Father 90       CAD, 7 vessel bypass at age 43    Cancer Father    Heart failure Paternal Grandmother    Heart attack Paternal Grandfather    Heart attack Maternal Uncle      Past Surgical History:  Procedure Laterality Date   EXTRACORPOREAL SHOCK WAVE LITHOTRIPSY Right 03/06/2024   Procedure: LITHOTRIPSY, ESWL;  Surgeon: Twylla Glendia BROCKS, MD;  Location: [facility] ORS;  Service: Urology;  Laterality: Right;    Social History  Socioeconomic History   Marital status: Single    Spouse name: Not on file   Number of children: Not on file   Years of education: Not on file   Highest education level: Bachelor's degree (e.g., BA, AB, BS)  Occupational History   Occupation: quarry manager  Tobacco Use   Smoking status: Former    Current packs/day: 0.00    Average packs/day: 0.1 packs/day for 25.0 years (2.5 ttl pk-yrs)    Types: Cigarettes    Start date: 75    Quit date: 2009    Years since quitting: 16.8    Passive exposure: Past   Smokeless tobacco: Never  Substance and Sexual Activity   Alcohol use: No   Drug use: No   Sexual activity: Never  Other Topics Concern   Not on file  Social History Narrative   Patient has retired from his [employer] job and  relocated to [place] from [place],  To assist his parents in their old age.  He has since moved in with his mother since his father passed.    Social Drivers of Corporate Investment Banker Strain: Low Risk  (03/25/2024)   Received from [facility]   Overall  Financial Resource Strain (CARDIA)    Difficulty of Paying Living Expenses: Not hard at all  Food Insecurity: No Food Insecurity (03/25/2024)   Received from [facility]   Hunger Vital Sign    Within the past 12 months, you worried that your food would run out before you got the money to buy more.: Never true    Within the past 12 months, the food you bought just didn't last and you didn't have money to get more.: Never true  Transportation Needs: No Transportation Needs (03/25/2024)   Received from [facility] - Transportation    In the past 12 months, has lack of transportation kept you from medical appointments or from getting medications?: No    Lack of Transportation (Non-Medical): No  Physical Activity: Insufficiently Active (02/24/2024)   Exercise Vital Sign    Days of Exercise per Week: 2 days    Minutes of Exercise per Session: 30 min  Stress: No Stress Concern Present (02/24/2024)   [employer] of Occupational Health - Occupational Stress Questionnaire    Feeling of Stress: Only a little  Social Connections: Moderately Integrated (02/24/2024)   Social Connection and Isolation Panel    Frequency of Communication with Friends and Family: More than three times a week    Frequency of Social Gatherings with Friends and Family: Once a week    Attends Religious Services: More than 4 times per year    Active Member of Clubs or Organizations: Yes    Attends Banker Meetings: More than 4 times per year    Marital Status: Never married  Intimate Partner Violence: Not At Risk (06/24/2017)   Humiliation, Afraid, Rape, and Kick questionnaire    Fear of Current or Ex-Partner: No    Emotionally Abused: No    Physically Abused: No    Sexually Abused: No     No Known Allergies   CBC    Component Value Date/Time   WBC 5.2 02/28/2024 1052   RBC 4.98 02/28/2024 1052   HGB 15.9 02/28/2024 1052   HCT 47.0 02/28/2024 1052   PLT  224.0 02/28/2024 1052   MCV 94.6 02/28/2024 1052   MCHC 33.8 02/28/2024 1052   RDW 13.2 02/28/2024 1052  LYMPHSABS 1.2 02/28/2024 1052   MONOABS 0.5 02/28/2024 1052   EOSABS 0.1 02/28/2024 1052   BASOSABS 0.1 02/28/2024 1052    Pulmonary Functions Testing Results:     No data to display          Outpatient Medications Prior to Visit  Medication Sig Dispense Refill   ALPRAZolam  (XANAX ) 0.25 MG tablet TAKE 1/2 TABLET BY MOUTH DAILY AS NEEDEDFOR ANXIETY OR SLEEP 20 tablet 5   atorvastatin  (LIPITOR) 20 MG tablet Take 1 tablet (20 mg total) by mouth daily. 90 tablet 3   minoxidil  (LONITEN ) 2.5 MG tablet Take 0.5 tablets (1.25 mg total) by mouth daily. 30 tablet 3   tamsulosin  (FLOMAX ) 0.4 MG CAPS capsule Take 0.4 mg by mouth daily.     telmisartan  (MICARDIS ) 20 MG tablet Take 1 tablet (20 mg total) by mouth daily. 90 tablet 3   traZODone  (DESYREL ) 50 MG tablet Take 0.5-1 tablets (25-50 mg total) by mouth at bedtime as needed for sleep. 30 tablet 3   triamcinolone (KENALOG) 0.1 % Apply topically 2 (two) times daily.     No facility-administered medications prior to visit.

## 2024-04-16 NOTE — Patient Instructions (Addendum)
  VISIT SUMMARY: Today, we discussed the follow-up of your pulmonary nodules and your allergic rhinitis. We reviewed your recent CT scans and your current symptoms, including your morning cough. Your oxygen levels are normal, and your lungs are clear upon examination.  YOUR PLAN: -PULMONARY NODULES: Pulmonary nodules are small growths in the lungs. Your CT scans show some nodules resolving and new ones appearing, which suggests an inflammatory process rather than cancer. A larger nodule on the right side appears benign. We will continue to monitor these nodules with a follow-up CT scan scheduled for August 2026. Please report any new respiratory symptoms, such as difficulty breathing or wheezing, promptly.  -ALLERGIC RHINITIS: Allergic rhinitis is an allergic reaction that causes sneezing, congestion, and a runny nose. Your morning cough is likely due to allergies, especially in the fall. Your oxygen levels are normal, and your lungs are clear. Start taking Claritin for your allergies and monitor for any changes in your symptoms. Report if your symptoms worsen.  INSTRUCTIONS: Please schedule a follow-up CT scan in August 2026 to monitor your pulmonary nodules. Report any new respiratory symptoms, such as difficulty breathing or wheezing, promptly. Start taking Claritin for your allergies and monitor for any changes in your symptoms.

## 2024-05-09 ENCOUNTER — Other Ambulatory Visit: Payer: Self-pay | Admitting: Internal Medicine

## 2024-05-19 ENCOUNTER — Ambulatory Visit: Admitting: Student in an Organized Health Care Education/Training Program

## 2024-06-02 ENCOUNTER — Encounter: Payer: Self-pay | Admitting: Internal Medicine

## 2024-06-02 ENCOUNTER — Ambulatory Visit: Admitting: Internal Medicine

## 2024-06-02 VITALS — BP 118/82 | HR 58 | Ht 75.0 in | Wt 176.0 lb

## 2024-06-02 DIAGNOSIS — R809 Proteinuria, unspecified: Secondary | ICD-10-CM | POA: Diagnosis not present

## 2024-06-02 DIAGNOSIS — N200 Calculus of kidney: Secondary | ICD-10-CM | POA: Diagnosis not present

## 2024-06-02 DIAGNOSIS — K5730 Diverticulosis of large intestine without perforation or abscess without bleeding: Secondary | ICD-10-CM | POA: Diagnosis not present

## 2024-06-02 DIAGNOSIS — E785 Hyperlipidemia, unspecified: Secondary | ICD-10-CM

## 2024-06-02 DIAGNOSIS — Z800 Family history of malignant neoplasm of digestive organs: Secondary | ICD-10-CM | POA: Insufficient documentation

## 2024-06-02 DIAGNOSIS — I10 Essential (primary) hypertension: Secondary | ICD-10-CM | POA: Diagnosis not present

## 2024-06-02 LAB — COMPREHENSIVE METABOLIC PANEL WITH GFR
ALT: 16 U/L (ref 0–53)
AST: 19 U/L (ref 0–37)
Albumin: 4.4 g/dL (ref 3.5–5.2)
Alkaline Phosphatase: 79 U/L (ref 39–117)
BUN: 16 mg/dL (ref 6–23)
CO2: 30 meq/L (ref 19–32)
Calcium: 9.5 mg/dL (ref 8.4–10.5)
Chloride: 103 meq/L (ref 96–112)
Creatinine, Ser: 0.87 mg/dL (ref 0.40–1.50)
GFR: 89.29 mL/min (ref >60.00)
Glucose, Bld: 92 mg/dL (ref 70–99)
Potassium: 4.1 meq/L (ref 3.5–5.1)
Sodium: 137 meq/L (ref 135–145)
Total Bilirubin: 0.9 mg/dL (ref 0.2–1.2)
Total Protein: 6.9 g/dL (ref 6.0–8.3)

## 2024-06-02 LAB — MICROALBUMIN / CREATININE URINE RATIO
Creatinine,U: 105.4 mg/dL
Microalb Creat Ratio: UNDETERMINED mg/g (ref 0.0–30.0)
Microalb, Ur: <0.7 mg/dL

## 2024-06-02 MED ORDER — MINOXIDIL 2.5 MG PO TABS: 1.2500 mg | ORAL_TABLET | Freq: Every day | ORAL | 3 refills | Status: AC

## 2024-06-02 NOTE — Assessment & Plan Note (Signed)
 S/p successful  lithotripsy of 1.3 cm stone in right renal pelvis .

## 2024-06-02 NOTE — Assessment & Plan Note (Signed)
 5 yr follow up due in 2030

## 2024-06-02 NOTE — Assessment & Plan Note (Signed)
 Found on recent colonoscopy.  Continue miralax  and BFL.

## 2024-06-02 NOTE — Assessment & Plan Note (Signed)
 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  Continue telmisartan 

## 2024-06-02 NOTE — Patient Instructions (Signed)
 The protein in your urine should decrease with continued use of telmisartan   We are repeating that test today along with an assessment of your electrolytes and kidney function   May the Lord give you and Nichole peace and joy during this holiday season, and may the promise of His return bring you comfort and hope for the future.  Regards,   Verneita Kettering, MD

## 2024-06-02 NOTE — Progress Notes (Signed)
 Subjective:  Patient ID: Bobby Mills, male    DOB: 1956-05-13  Age: 68 y.o. MRN: [id number]  CC: The primary encounter diagnosis was Essential hypertension. Diagnoses of Hyperlipidemia LDL goal <130, Nephrolithiasis, Proteinuria, unspecified type, Diverticulosis of colon without diverticulitis, and Family history of colon cancer in father were also pertinent to this visit.   HPI Bobby Mills presents for  Chief Complaint  Patient presents with   Medical Management of Chronic Issues    3 month follow up    1) s/p lithotripsy of right renal pelvis stone.  Passed fragments for 5 days.  Symptoms resolved.  Since his rx for tamsulosin  was stopped he ha shad some urinary hesitancy/decreased flow,  but not enough to restart medication at this time. . May want to restart for voiding purposes .  Lost 10 lbs  now regained.   2) GAD:  sleeping better with trazodone  and prn use of alprazolam  not even weekly . exercising 3 times weekly but not inclined  to go unless Mom wants to go.    2) HTN with proteinuria:  now taking telmisartan   home readings < 130/80.  Nephrology evaluation reviewed.    3) Diverticulosis: noted on colonoscopy last month.  No polyps.  Constipation managed  using miralax  daily. And alternated his BLF at night . t 5 yr follow up due to FH of colon CA   4) Lung nodules:  inflammatory in nature per [facility]  follow up CT and pulm in August 2026 . Used claritin for a while to manage AM congestion and PND>    Outpatient Medications Prior to Visit  Medication Sig Dispense Refill   ALPRAZolam  (XANAX ) 0.25 MG tablet TAKE 1/2 TABLET BY MOUTH DAILY AS NEEDEDFOR ANXIETY OR SLEEP 20 tablet 5   atorvastatin  (LIPITOR) 20 MG tablet Take 1 tablet (20 mg total) by mouth daily. 90 tablet 3   tamsulosin  (FLOMAX ) 0.4 MG CAPS capsule Take 0.4 mg by mouth daily.     telmisartan  (MICARDIS ) 20 MG tablet Take 1 tablet (20 mg total) by mouth daily. 90 tablet 3   traZODone  (DESYREL ) 50 MG tablet Take  0.5-1 tablets (25-50 mg total) by mouth at bedtime as needed for sleep. 30 tablet 3   triamcinolone (KENALOG) 0.1 % Apply topically 2 (two) times daily.     minoxidil  (LONITEN ) 2.5 MG tablet Take 0.5 tablets (1.25 mg total) by mouth daily. 30 tablet 3   No facility-administered medications prior to visit.    Review of Systems;  Patient denies headache, fevers, malaise, unintentional weight loss, skin rash, eye pain, sinus congestion and sinus pain, sore throat, dysphagia,  hemoptysis , cough, dyspnea, wheezing, chest pain, palpitations, orthopnea, edema, abdominal pain, nausea, melena, diarrhea, constipation, flank pain, dysuria, hematuria, urinary  Frequency, nocturia, numbness, tingling, seizures,  Focal weakness, Loss of consciousness,  Tremor, insomnia, depression, anxiety, and suicidal ideation.      Objective:  BP 118/82   Pulse (!) 58   Ht 6' 3 (1.905 m)   Wt 176 lb (79.8 kg)   SpO2 99%   BMI 22.00 kg/m   BP Readings from Last 3 Encounters:  06/02/24 118/82  04/16/24 122/72  03/27/24 120/78    Wt Readings from Last 3 Encounters:  06/02/24 176 lb (79.8 kg)  04/16/24 174 lb (78.9 kg)  03/27/24 171 lb (77.6 kg)    Physical Exam Vitals reviewed.  Constitutional:      General: He is not in acute distress.    Appearance: Normal appearance.  He is normal weight. He is not ill-appearing, toxic-appearing or diaphoretic.  HENT:     Head: Normocephalic.  Eyes:     General: No scleral icterus.       Right eye: No discharge.        Left eye: No discharge.     Conjunctiva/sclera: Conjunctivae normal.  Cardiovascular:     Rate and Rhythm: Normal rate and regular rhythm.     Heart sounds: Normal heart sounds.  Pulmonary:     Effort: Pulmonary effort is normal. No respiratory distress.     Breath sounds: Normal breath sounds.  Musculoskeletal:        General: Normal range of motion.     Cervical back: Normal range of motion.  Skin:    General: Skin is warm and dry.   Neurological:     General: No focal deficit present.     Mental Status: He is alert and oriented to person, place, and time. Mental status is at baseline.  Psychiatric:        Mood and Affect: Mood normal.        Behavior: Behavior normal.        Thought Content: Thought content normal.        Judgment: Judgment normal.     Lab Results  Component Value Date   HGBA1C 5.3 09/29/2022    Lab Results  Component Value Date   CREATININE 0.88 02/28/2024   CREATININE 0.88 10/15/2023   CREATININE 0.96 07/24/2023    Lab Results  Component Value Date   WBC 5.2 02/28/2024   HGB 15.9 02/28/2024   HCT 47.0 02/28/2024   PLT 224.0 02/28/2024   GLUCOSE 73 02/28/2024   CHOL 113 10/15/2023   TRIG 95.0 10/15/2023   HDL 41.60 10/15/2023   LDLDIRECT 58.0 10/15/2023   LDLCALC 52 10/15/2023   ALT 15 02/28/2024   AST 18 02/28/2024   NA 136 02/28/2024   K 4.4 02/28/2024   CL 103 02/28/2024   CREATININE 0.88 02/28/2024   BUN 13 02/28/2024   CO2 27 02/28/2024   TSH 1.29 02/28/2024   PSA 0.36 10/15/2023   HGBA1C 5.3 09/29/2022   MICROALBUR 19.1 (H) 02/28/2024    Abdomen 1 view (KUB) Result Date: 04/01/2024 CLINICAL DATA:  Right-sided nephrolithiasis.  Recent lithotripsy. EXAM: ABDOMEN - 1 VIEW COMPARISON:  Radiograph 03/06/2024, CT 01/24/2024 FINDINGS: The previous right renal stone is no longer seen. No evidence of stone or stone fragments. Stable calcification just inferior to the right sacroiliac joint, corresponding to vascular calcifications on CT. Stable calcification projecting over the upper left kidney. Moderate colonic stool burden without evidence of obstruction. Included lung bases are clear. IMPRESSION: 1. The previous right renal stone is no longer seen. No evidence of stone or stone fragments. 2. Stable left renal stone. Electronically Signed   By: Andrea Gasman M.D.   On: 04/01/2024 17:14    Assessment & Plan:  .Essential hypertension Assessment & Plan: Currently  managed with telmisartan  only for  new onset microalbuminuria.   Nephrology referral completed,  no changes to medications .  Repeat UaCr pending  Lab Results  Component Value Date   LABMICR Comment 03/27/2024   LABMICR See below: 02/16/2022   MICROALBUR 19.1 (H) 02/28/2024       Orders: -     Microalbumin / creatinine urine ratio -     Comprehensive metabolic panel with GFR  Hyperlipidemia LDL goal <130  Nephrolithiasis Assessment & Plan: S/p successful  lithotripsy of 1.3  cm stone in right renal pelvis .     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  Continue telmisartan     Diverticulosis of colon without diverticulitis Assessment & Plan: Found on recent colonoscopy.  Continue miralax  and BFL.   Family history of colon cancer in father Assessment & Plan: 5 yr follow up due in 2030   Other orders -     Minoxidil ; Take 0.5 tablets (1.25 mg total) by mouth daily.  Dispense: 30 tablet; Refill: 3     I spent 34 minutes on the day of this face to face encounter reviewing patient's  most recent visit with Urology,  nephrology,  and gastroenterology, recent colonoscopy and lithotripsy procedure , recent  labs and imaging studies, counseling on nephropathy ,  reviewing the assessment and plan with patient, and post visit ordering and reviewing of  diagnostics and therapeutics with patient  .   Follow-up: Return in about 6 months (around 12/01/2024).   Verneita LITTIE Kettering, MD

## 2024-06-02 NOTE — Assessment & Plan Note (Signed)
 Currently managed with telmisartan  only for  new onset microalbuminuria.   Nephrology referral completed,  no changes to medications .  Repeat UaCr pending  Lab Results  Component Value Date   LABMICR Comment 03/27/2024   LABMICR See below: 02/16/2022   MICROALBUR 19.1 (H) 02/28/2024

## 2024-06-03 ENCOUNTER — Ambulatory Visit: Payer: Self-pay | Admitting: Internal Medicine

## 2024-07-16 ENCOUNTER — Encounter: Payer: Self-pay | Admitting: Internal Medicine

## 2024-07-16 MED ORDER — TRAZODONE HCL 50 MG PO TABS: 25.0000 mg | ORAL_TABLET | Freq: Every evening | ORAL | 1 refills | Status: AC | PRN

## 2024-12-01 ENCOUNTER — Ambulatory Visit: Admitting: Internal Medicine

## 2025-01-26 ENCOUNTER — Ambulatory Visit

## 2025-03-27 ENCOUNTER — Ambulatory Visit: Admitting: Physician Assistant
